# Patient Record
Sex: Male | Born: 1985 | Race: White | Hispanic: No | Marital: Single | Smoking: Current every day smoker
Health system: Southern US, Community
[De-identification: ages and names within clinical notes are randomized; demographics above are authoritative.]

## PROBLEM LIST (undated history)

## (undated) DIAGNOSIS — F329 Major depressive disorder, single episode, unspecified: Secondary | ICD-10-CM

## (undated) DIAGNOSIS — F259 Schizoaffective disorder, unspecified: Secondary | ICD-10-CM

## (undated) DIAGNOSIS — C9591 Leukemia, unspecified, in remission: Secondary | ICD-10-CM

## (undated) HISTORY — PX: OTHER SURGICAL HISTORY: SHX169

---

## 2018-03-23 ENCOUNTER — Encounter (HOSPITAL_COMMUNITY): Payer: Self-pay | Admitting: *Deleted

## 2018-03-23 ENCOUNTER — Emergency Department (HOSPITAL_COMMUNITY)
Admission: EM | Admit: 2018-03-23 | Discharge: 2018-03-23 | Disposition: A | Discharge status: Home / Self Care | Payer: Self-pay | Attending: Emergency Medicine | Admitting: Emergency Medicine

## 2018-03-23 ENCOUNTER — Encounter (HOSPITAL_COMMUNITY): Payer: Self-pay

## 2018-03-23 ENCOUNTER — Emergency Department (HOSPITAL_COMMUNITY): Payer: Self-pay

## 2018-03-23 DIAGNOSIS — R079 Chest pain, unspecified: Secondary | ICD-10-CM | POA: Insufficient documentation

## 2018-03-23 DIAGNOSIS — Z5321 Procedure and treatment not carried out due to patient leaving prior to being seen by health care provider: Secondary | ICD-10-CM | POA: Insufficient documentation

## 2018-03-23 DIAGNOSIS — Z915 Personal history of self-harm: Secondary | ICD-10-CM | POA: Insufficient documentation

## 2018-03-23 DIAGNOSIS — R000 Tachycardia, unspecified: Secondary | ICD-10-CM | POA: Insufficient documentation

## 2018-03-23 DIAGNOSIS — F1520 Other stimulant dependence, uncomplicated: Secondary | ICD-10-CM | POA: Insufficient documentation

## 2018-03-23 DIAGNOSIS — F17200 Nicotine dependence, unspecified, uncomplicated: Secondary | ICD-10-CM | POA: Insufficient documentation

## 2018-03-23 HISTORY — DX: Leukemia, unspecified, in remission: C95.91

## 2018-03-23 LAB — BASIC METABOLIC PANEL
Anion gap: 7 (ref 5–15)
BUN: 15 mg/dL (ref 6–20)
CALCIUM: 9.1 mg/dL (ref 8.9–10.3)
CO2: 30 mmol/L (ref 22–32)
CREATININE: 0.75 mg/dL (ref 0.61–1.24)
Chloride: 104 mmol/L (ref 101–111)
GFR calc Af Amer: >60 mL/min (ref >60)
Glucose, Bld: 89 mg/dL (ref 65–99)
POTASSIUM: 3.6 mmol/L (ref 3.5–5.1)
SODIUM: 141 mmol/L (ref 135–145)

## 2018-03-23 LAB — CBC
HEMATOCRIT: 44.4 % (ref 39.0–52.0)
Hemoglobin: 14.5 g/dL (ref 13.0–17.0)
MCH: 29.7 pg (ref 26.0–34.0)
MCHC: 32.7 g/dL (ref 30.0–36.0)
MCV: 91 fL (ref 78.0–100.0)
PLATELETS: 255 10*3/uL (ref 150–400)
RBC: 4.88 MIL/uL (ref 4.22–5.81)
RDW: 13 % (ref 11.5–15.5)
WBC: 8 10*3/uL (ref 4.0–10.5)

## 2018-03-23 LAB — I-STAT TROPONIN, ED: TROPONIN I, POC: 0 ng/mL (ref 0.00–0.08)

## 2018-03-23 NOTE — ED Notes (Signed)
Pt asking to leave, IV removed at triage per patient request, pt ambulatory without distress

## 2018-03-23 NOTE — Discharge Instructions (Signed)
Follow-up with the resources provided to you to help with your methamphetamine detox. Stop using drugs. Return to the ER as needed for any new or concerning symptoms.

## 2018-03-23 NOTE — ED Notes (Signed)
Bed: [facility]PT3 Expected date:  Expected time:  Means of arrival:  Comments: 

## 2018-03-23 NOTE — ED Provider Notes (Signed)
[facility] DEPT Provider Note   CSN: [id number] Arrival date & time: 03/23/18  6073     History   Chief Complaint No chief complaint on file.   HPI Rick Bray is a 32 y.o. male presenting with request for resources for detox from meth.   Pt states he has been trying to get clean from meth for the past year. He last used this morning. He also smokes marijuana, denies other drug use. He states he has been trying to get into a place in [place], but has been waiting 2 months. He recently came to [place] 2 days ago, and is interested in knowing what resources there are. He denies CP, SOB, abd pain, n/v. He in in remission for leukemia (since 2014). Takes no medication daily. He smokes cigarettes, denies etoh use. No complaints at this time.  He denies SI, HI, or AVH. He states he tells hospitals he is suicidal to help with detox from meth. He has cut himself in the past, no cuts recently.  HPI  Past Medical History:  Diagnosis Date  . Leukemia in remission ([place])     There are no active problems to display for this patient.   History reviewed. No pertinent surgical history.      Home Medications    Prior to Admission medications   Not on File    Family History History reviewed. No pertinent family history.  Social History Social History   Tobacco Use  . Smoking status: Current Every Day Smoker  . Smokeless tobacco: Never Used  Substance Use Topics  . Alcohol use: Yes  . Drug use: Yes    Types: Marijuana, Methamphetamines     Allergies   Patient has no known allergies.   Review of Systems Review of Systems  Psychiatric/Behavioral:       Request for detox from meth  All other systems reviewed and are negative.    Physical Exam Updated Vital Signs BP 127/80 (BP Location: Left Arm)   Pulse (!) 101   Temp 98.6 F (37 C) (Oral)   Resp 18   SpO2 99%   Physical Exam  Constitutional: He is oriented to person,  place, and time. He appears well-developed and well-nourished. No distress.  HENT:  Head: Normocephalic and atraumatic.  Eyes: EOM are normal.  Neck: Normal range of motion.  Cardiovascular: Regular rhythm and intact distal pulses.  Mildly tachycardic ~100  Pulmonary/Chest: Effort normal and breath sounds normal. No respiratory distress. He has no wheezes.  Speaking in full sentences. Clear lung sounds in all fields.   Abdominal: Soft. He exhibits no distension. There is no tenderness.  Musculoskeletal: Normal range of motion.  Neurological: He is alert and oriented to person, place, and time.  Skin: Skin is warm. No rash noted.  Psychiatric: He has a normal mood and affect.  Nursing note and vitals reviewed.    ED Treatments / Results  Labs (all labs ordered are listed, but only abnormal results are displayed) Labs Reviewed - No data to display  EKG None  Radiology Dg Chest 2 View  Result Date: 03/23/2018 CLINICAL DATA:  Chest pain with no other symptoms. History of leukemia in remission. Current smoker. EXAM: CHEST - 2 VIEW COMPARISON:  None in PACs FINDINGS: The lungs are adequately inflated. There is no focal infiltrate. There is no pleural effusion. The heart and pulmonary vascularity are normal. There is no mediastinal or hilar lymphadenopathy. The bony thorax is unremarkable. IMPRESSION: There is no  active cardiopulmonary disease. Electronically Signed   By: David  Martinique M.D.   On: 03/23/2018 10:59    Procedures Procedures (including critical care time)  Medications Ordered in ED Medications - No data to display   Initial Impression / Assessment and Plan / ED Course  I have reviewed the triage vital signs and the nursing notes.  Pertinent labs & imaging results that were available during my care of the patient were reviewed by me and considered in my medical decision making (see chart for details).     Patient requesting resources and help with detox from meth.   No concerns at this time.  Physical exam shows patient who is calm and cooperative, and in no acute distress.  Recent labs performed today at [facility] without concerning abnormalities.  Discussed with case management, who will provide resources.  Peer support ordered.  At this time, patient appears safe for discharge.  Return precautions given.  Patient states he understands and agrees to plan.   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

## 2018-03-23 NOTE — Progress Notes (Addendum)
CSW met with pt to offer resources, initally pt refused but as session progressed, pt accepted meeting schedules for area Alcoholics Anonymous and Narcotics Anonymous 12-Step meetings as well as inpatient/outpatient SA Tx resources.  CSW provided education to the pt as to the efficacy of 12-step programs for community support for those needing support in addition to or other than inpatient/outpatient treatment and provided pt with list of [employer] in [place] and educated the pt on making contact, assessing for open beds and requesting an interview/assessment for admission. Pt appreciated CSW's efforts and thanked the CSW.  Pt stated he was able to stay at the [facility] and to sleep in the lobby.  Please reconsult if future social work needs arise.  CSW signing off, as social work intervention is no longer needed.  Alphonse Guild. Mekhi Sonn, LCSW, LCAS, CSI Clinical Social Worker Ph: [phone]       2

## 2018-03-23 NOTE — ED Triage Notes (Signed)
Pt in via EMS to triage- c/o chest pain after walking for an hour, pain rated 7/10, denies n/v, pt reports that he smoked methamphetamines and used marijuana this morning, no distress noted

## 2018-03-23 NOTE — ED Triage Notes (Signed)
Pt here for help with methamphetamine detox, he states he has been trying to get in a program in [place] and has been stranded here for a few days, he was seen at [facility] earlier for chest pain from doing doing meth  Pt denies SI at this time but does have a history of cutting

## 2018-03-25 ENCOUNTER — Emergency Department (HOSPITAL_COMMUNITY)
Admission: EM | Admit: 2018-03-25 | Discharge: 2018-03-26 | Disposition: A | Discharge status: Other Acute Inpatient Hospital | Payer: Self-pay | Attending: Emergency Medicine | Admitting: Emergency Medicine

## 2018-03-25 DIAGNOSIS — F1520 Other stimulant dependence, uncomplicated: Secondary | ICD-10-CM | POA: Insufficient documentation

## 2018-03-25 DIAGNOSIS — R45851 Suicidal ideations: Secondary | ICD-10-CM | POA: Insufficient documentation

## 2018-03-25 DIAGNOSIS — F17200 Nicotine dependence, unspecified, uncomplicated: Secondary | ICD-10-CM | POA: Insufficient documentation

## 2018-03-25 DIAGNOSIS — F332 Major depressive disorder, recurrent severe without psychotic features: Secondary | ICD-10-CM | POA: Insufficient documentation

## 2018-03-25 LAB — RAPID URINE DRUG SCREEN, HOSP PERFORMED
AMPHETAMINES: POSITIVE — AB
BARBITURATES: NOT DETECTED
BENZODIAZEPINES: NOT DETECTED
Cocaine: NOT DETECTED
Opiates: NOT DETECTED
TETRAHYDROCANNABINOL: NOT DETECTED

## 2018-03-25 LAB — COMPREHENSIVE METABOLIC PANEL
ALBUMIN: 4 g/dL (ref 3.5–5.0)
ALT: 18 U/L (ref 17–63)
AST: 16 U/L (ref 15–41)
Alkaline Phosphatase: 64 U/L (ref 38–126)
Anion gap: 6 (ref 5–15)
BUN: 25 mg/dL — AB (ref 6–20)
CHLORIDE: 108 mmol/L (ref 101–111)
CO2: 27 mmol/L (ref 22–32)
CREATININE: 0.67 mg/dL (ref 0.61–1.24)
Calcium: 8.8 mg/dL — ABNORMAL LOW (ref 8.9–10.3)
GFR calc Af Amer: >60 mL/min (ref >60)
GLUCOSE: 98 mg/dL (ref 65–99)
POTASSIUM: 4.1 mmol/L (ref 3.5–5.1)
Sodium: 141 mmol/L (ref 135–145)
Total Bilirubin: 0.5 mg/dL (ref 0.3–1.2)
Total Protein: 6.7 g/dL (ref 6.5–8.1)

## 2018-03-25 LAB — CBC
HEMATOCRIT: 40.2 % (ref 39.0–52.0)
HEMOGLOBIN: 13.6 g/dL (ref 13.0–17.0)
MCH: 30.4 pg (ref 26.0–34.0)
MCHC: 33.8 g/dL (ref 30.0–36.0)
MCV: 89.9 fL (ref 78.0–100.0)
Platelets: 260 10*3/uL (ref 150–400)
RBC: 4.47 MIL/uL (ref 4.22–5.81)
RDW: 13.4 % (ref 11.5–15.5)
WBC: 7.9 10*3/uL (ref 4.0–10.5)

## 2018-03-25 LAB — SALICYLATE LEVEL: Salicylate Lvl: <7 mg/dL (ref 2.8–30.0)

## 2018-03-25 LAB — ETHANOL

## 2018-03-25 LAB — ACETAMINOPHEN LEVEL: Acetaminophen (Tylenol), Serum: <10 ug/mL — ABNORMAL LOW (ref 10–30)

## 2018-03-25 MED ORDER — ACETAMINOPHEN 325 MG PO TABS: 650.0000 mg | ORAL_TABLET | ORAL | Status: DC | PRN

## 2018-03-25 MED ORDER — LIDOCAINE HCL (PF) 1 % IJ SOLN
5.0000 mL | Freq: Once | INTRAMUSCULAR | Status: AC
  Administered 2018-03-25: 5 mL via INTRADERMAL
  Filled 2018-03-25 (×3): qty 5

## 2018-03-25 MED ORDER — NICOTINE 21 MG/24HR TD PT24: 21.0000 mg | MEDICATED_PATCH | Freq: Every day | TRANSDERMAL | Status: DC

## 2018-03-25 MED ORDER — ONDANSETRON HCL 4 MG PO TABS: 4.0000 mg | ORAL_TABLET | Freq: Three times a day (TID) | ORAL | Status: DC | PRN

## 2018-03-25 MED ORDER — DOXYCYCLINE HYCLATE 100 MG PO TABS
100.0000 mg | ORAL_TABLET | Freq: Two times a day (BID) | ORAL | Status: DC
  Administered 2018-03-25: 100 mg via ORAL
  Filled 2018-03-25: qty 1

## 2018-03-25 MED ORDER — DOXYCYCLINE HYCLATE 100 MG PO TABS: 100.0000 mg | ORAL_TABLET | Freq: Once | ORAL | Status: DC

## 2018-03-25 MED ORDER — ALUM & MAG HYDROXIDE-SIMETH 200-200-20 MG/5ML PO SUSP: 30.0000 mL | Freq: Four times a day (QID) | ORAL | Status: DC | PRN

## 2018-03-25 NOTE — ED Triage Notes (Signed)
Patient stated he was suicidal at [place]. He stated he wanted to cut hisself or OD on drugs. Patient states he has cut hisself really bad before. Patient is crying and depressed.

## 2018-03-25 NOTE — ED Provider Notes (Signed)
[facility] DEPT Provider Note   CSN: [id number] Arrival date & time: 03/25/18  1911     History   Chief Complaint Chief Complaint  Patient presents with  . Suicidal    HPI Rick Bray is a 32 y.o. male who presents to the ED with a cc of suicidal ideation. The patient states that he is feeling hopeless and suicidal. He thought about overdosing on heroin because he could " just go to sleep." The patient is tearful and states that his family has " turned their back on me." He uses meth and etoh but has not use in the past 3 days. He has a hx of Meth IVDU and states that he has not shot up in weeks.  He is complaining of a small sore on his right thoracic area and accidentally cut his right middle finger about 3 days ago with a knife.  He complains of pain and swelling over the dorsum of his right middle finger.  HPI  Past Medical History:  Diagnosis Date  . Leukemia in remission ([place])     There are no active problems to display for this patient.   No past surgical history on file.      Home Medications    Prior to Admission medications   Not on File    Family History No family history on file.  Social History Social History   Tobacco Use  . Smoking status: Current Every Day Smoker  . Smokeless tobacco: Never Used  Substance Use Topics  . Alcohol use: Yes  . Drug use: Yes    Types: Marijuana, Methamphetamines     Allergies   Risperidone and related   Review of Systems Review of Systems Ten systems reviewed and are negative for acute change, except as noted in the HPI.    Physical Exam Updated Vital Signs BP 132/85 (BP Location: Left Arm)   Pulse 85   Temp 98.6 F (37 C) (Oral)   Resp 16   Ht 5\' 6"  (1.676 m)   Wt 59 kg (130 lb)   SpO2 100%   BMI 20.98 kg/m   Physical Exam  Constitutional: He appears well-developed and well-nourished. No distress.  HENT:  Head: Normocephalic and atraumatic.  Eyes:  Conjunctivae are normal. No scleral icterus.  Neck: Normal range of motion. Neck supple.  Cardiovascular: Normal rate, regular rhythm and normal heart sounds.  Pulmonary/Chest: Effort normal and breath sounds normal. No respiratory distress.  Abdominal: Soft. There is no tenderness.  Musculoskeletal: He exhibits no edema.  Right middle finger with macerated laceration over the dorsum.  Tender to palpation, no purulent discharge or abscess.  Able to move the finger at all joints  Neurological: He is alert.  Skin: Skin is warm and dry. He is not diaphoretic.  2 cm raised erythematous nodule with central crusting, tender to palpation with surrounding erythema  Psychiatric: His behavior is normal.  Nursing note and vitals reviewed.    ED Treatments / Results  Labs (all labs ordered are listed, but only abnormal results are displayed) Labs Reviewed  COMPREHENSIVE METABOLIC PANEL - Abnormal; Notable for the following components:      Result Value   BUN 25 (*)    Calcium 8.8 (*)    All other components within normal limits  ACETAMINOPHEN LEVEL - Abnormal; Notable for the following components:   Acetaminophen (Tylenol), Serum <10 (*)    All other components within normal limits  RAPID URINE DRUG SCREEN, HOSP  PERFORMED - Abnormal; Notable for the following components:   Amphetamines POSITIVE (*)    All other components within normal limits  ETHANOL  SALICYLATE LEVEL  CBC    INCISION AND DRAINAGE Performed by: Margarita Mail Consent: Verbal consent obtained. Risks and benefits: risks, benefits and alternatives were discussed Type: abscess  Body area: R axillary line  Anesthesia: local infiltration  Incision was made with a scalpel.  Local anesthetic: lidocaine 1% w/o epinephrine  Anesthetic total: 3 ml  Complexity: complex Blunt dissection to break up loculations  Drainage: purulent  Drainage amount: minimal  Packing material: 1/4 in iodoform gauze  Patient  tolerance: Patient tolerated the procedure well with no immediate complications.   EKG None  Radiology No results found.  Procedures Procedures (including critical care time)  Medications Ordered in ED Medications  doxycycline (VIBRA-TABS) tablet 100 mg (has no administration in time range)  acetaminophen (TYLENOL) tablet 650 mg (has no administration in time range)  ondansetron (ZOFRAN) tablet 4 mg (has no administration in time range)  alum & mag hydroxide-simeth (MAALOX/MYLANTA) 200-200-20 MG/5ML suspension 30 mL (has no administration in time range)  nicotine (NICODERM CQ - dosed in mg/24 hours) patch 21 mg (has no administration in time range)  lidocaine (PF) (XYLOCAINE) 1 % injection 5 mL (5 mLs Intradermal Given 03/25/18 2155)     Initial Impression / Assessment and Plan / ED Course  I have reviewed the triage vital signs and the nursing notes.  Pertinent labs & imaging results that were available during my care of the patient were reviewed by me and considered in my medical decision making (see chart for details).     Patient medically clear for psychiatric evaluation.  He has been placed on 1 week of doxycycline.  Incision and drainage performed with minimal purulent drainage from the small abscess on his right axillary line.  Patient may need bandage changing.  Final Clinical Impressions(s) / ED Diagnoses   Final diagnoses:  Suicidal ideation    ED Discharge Orders    None       Margarita Mail, PA-C 03/25/18 2235    Lacretia Leigh, MD 03/26/18 1646

## 2018-03-25 NOTE — BHH Counselor (Signed)
Pt has been accepted to [facility] by Arville Go, [facility] and assigned to room/bed: 306-1. Attending physician: Dr. Mallie Darting. Pt can come now. Nursing report: [phone]. Support paperwork is completed and faxed. Updated dispostion dicussed with Craig, RN.   Vertell Novak, MS, [facility], [facility] Triage Specialist [phone]

## 2018-03-25 NOTE — ED Notes (Signed)
Patient's  property a machete, pocket knife, homemade shank and phillip screwdriver given to security officers to secure.

## 2018-03-25 NOTE — ED Provider Notes (Signed)
Medical screening examination/treatment/procedure(s) were conducted as a shared visit with non-physician practitioner(s) and myself.  I personally evaluated the patient during the encounter.  None 32 year old male here complaining of suicidal ideations.  He also has a prior cut to the dorsal aspect of his right middle finger.  No evidence of extensive infection.  Can treat with oral antibiotics.  We will medically clear him and have evaluation by psychiatry   Lacretia Leigh, MD 03/25/18 2144
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
## 2018-03-25 NOTE — ED Notes (Signed)
Bed: PNS25 Expected date:  Expected time:  Means of arrival:  Comments: RM 29

## 2018-03-26 ENCOUNTER — Other Ambulatory Visit: Payer: Self-pay

## 2018-03-26 ENCOUNTER — Inpatient Hospital Stay (HOSPITAL_COMMUNITY)
Admission: AD | Admit: 2018-03-26 | Discharge: 2018-03-31 | DRG: 885 | Disposition: A | Discharge status: Home / Self Care | Payer: No Typology Code available for payment source | Source: Intra-hospital | Attending: Psychiatry | Admitting: Psychiatry

## 2018-03-26 ENCOUNTER — Encounter (HOSPITAL_COMMUNITY): Payer: Self-pay

## 2018-03-26 DIAGNOSIS — Z888 Allergy status to other drugs, medicaments and biological substances status: Secondary | ICD-10-CM | POA: Diagnosis not present

## 2018-03-26 DIAGNOSIS — Z818 Family history of other mental and behavioral disorders: Secondary | ICD-10-CM

## 2018-03-26 DIAGNOSIS — Z590 Homelessness: Secondary | ICD-10-CM | POA: Diagnosis not present

## 2018-03-26 DIAGNOSIS — F17210 Nicotine dependence, cigarettes, uncomplicated: Secondary | ICD-10-CM | POA: Diagnosis present

## 2018-03-26 DIAGNOSIS — Z915 Personal history of self-harm: Secondary | ICD-10-CM

## 2018-03-26 DIAGNOSIS — F4000 Agoraphobia, unspecified: Secondary | ICD-10-CM | POA: Diagnosis present

## 2018-03-26 DIAGNOSIS — G4700 Insomnia, unspecified: Secondary | ICD-10-CM | POA: Diagnosis present

## 2018-03-26 DIAGNOSIS — F1994 Other psychoactive substance use, unspecified with psychoactive substance-induced mood disorder: Secondary | ICD-10-CM | POA: Diagnosis present

## 2018-03-26 DIAGNOSIS — Z825 Family history of asthma and other chronic lower respiratory diseases: Secondary | ICD-10-CM

## 2018-03-26 DIAGNOSIS — F332 Major depressive disorder, recurrent severe without psychotic features: Principal | ICD-10-CM | POA: Diagnosis present

## 2018-03-26 DIAGNOSIS — F1210 Cannabis abuse, uncomplicated: Secondary | ICD-10-CM | POA: Diagnosis present

## 2018-03-26 DIAGNOSIS — Z639 Problem related to primary support group, unspecified: Secondary | ICD-10-CM | POA: Diagnosis not present

## 2018-03-26 DIAGNOSIS — Z560 Unemployment, unspecified: Secondary | ICD-10-CM

## 2018-03-26 DIAGNOSIS — F1510 Other stimulant abuse, uncomplicated: Secondary | ICD-10-CM | POA: Diagnosis present

## 2018-03-26 DIAGNOSIS — C9591 Leukemia, unspecified, in remission: Secondary | ICD-10-CM | POA: Diagnosis present

## 2018-03-26 DIAGNOSIS — R45851 Suicidal ideations: Secondary | ICD-10-CM | POA: Diagnosis present

## 2018-03-26 DIAGNOSIS — F1524 Other stimulant dependence with stimulant-induced mood disorder: Secondary | ICD-10-CM

## 2018-03-26 DIAGNOSIS — F419 Anxiety disorder, unspecified: Secondary | ICD-10-CM | POA: Diagnosis not present

## 2018-03-26 DIAGNOSIS — J869 Pyothorax without fistula: Secondary | ICD-10-CM | POA: Diagnosis not present

## 2018-03-26 DIAGNOSIS — F1910 Other psychoactive substance abuse, uncomplicated: Secondary | ICD-10-CM | POA: Diagnosis not present

## 2018-03-26 MED ORDER — ARIPIPRAZOLE 5 MG PO TABS
5.0000 mg | ORAL_TABLET | Freq: Every day | ORAL | Status: DC
  Administered 2018-03-26 – 2018-03-30 (×5): 5 mg via ORAL
  Filled 2018-03-26 (×7): qty 1

## 2018-03-26 MED ORDER — HYDROXYZINE HCL 25 MG PO TABS
25.0000 mg | ORAL_TABLET | Freq: Four times a day (QID) | ORAL | Status: DC | PRN
  Administered 2018-03-28 – 2018-03-30 (×3): 25 mg via ORAL
  Filled 2018-03-26: qty 30
  Filled 2018-03-26 (×2): qty 1

## 2018-03-26 MED ORDER — ACETAMINOPHEN 325 MG PO TABS: 650.0000 mg | ORAL_TABLET | Freq: Four times a day (QID) | ORAL | Status: DC | PRN

## 2018-03-26 MED ORDER — ALUM & MAG HYDROXIDE-SIMETH 200-200-20 MG/5ML PO SUSP: 30.0000 mL | ORAL | Status: DC | PRN

## 2018-03-26 MED ORDER — MAGNESIUM HYDROXIDE 400 MG/5ML PO SUSP: 30.0000 mL | Freq: Every day | ORAL | Status: DC | PRN

## 2018-03-26 MED ORDER — TRAZODONE HCL 50 MG PO TABS
50.0000 mg | ORAL_TABLET | Freq: Every evening | ORAL | Status: DC | PRN
  Administered 2018-03-26 – 2018-03-30 (×6): 50 mg via ORAL
  Filled 2018-03-26 (×14): qty 1

## 2018-03-26 MED ORDER — CITALOPRAM HYDROBROMIDE 10 MG PO TABS
10.0000 mg | ORAL_TABLET | Freq: Every day | ORAL | Status: DC
  Administered 2018-03-26 – 2018-03-31 (×6): 10 mg via ORAL
  Filled 2018-03-26 (×6): qty 1
  Filled 2018-03-26: qty 21
  Filled 2018-03-26 (×2): qty 1
  Filled 2018-03-26: qty 21

## 2018-03-26 NOTE — H&P (Signed)
Psychiatric Admission Assessment Adult  Patient Identification: Itzael Liptak MRN:  [id number] Date of Evaluation:  03/26/2018 Chief Complaint: Depression  Principal Diagnosis: Substance-induced mood disorder, depressed .  Stimulant dependence .  Consider major depression , severe , without psychotic features  Diagnosis:   Patient Active Problem List   Diagnosis Date Noted  . MDD (major depressive disorder), recurrent episode, severe ([place]) [F33.2] 03/26/2018   History of Present Illness: 32 year old male, currently homeless, reports worsening depression and suicidal thoughts, with thoughts of cutting self.  Endorses significant neurovegetative symptoms of depression as below.  Reports he has been facing significant stressors to include homelessness and poor support network. He reports substance abuse, identifies methamphetamine as substance of choice, has been using almost daily /he also endorses cannabis abuse Associated Signs/Symptoms: Depression Symptoms:  depressed mood, anhedonia, insomnia, suicidal thoughts with specific plan, anxiety, panic attacks, loss of energy/fatigue, decreased appetite, (Hypo) Manic Symptoms:  Does not endorse/irritability Anxiety Symptoms: Reports worsening anxiety, states he worries excessively.  Also endorses agoraphobia Psychotic Symptoms: Denies PTSD Symptoms: Does not endorse Total Time spent with patient: 45 minutes  Past Psychiatric History: He reports a history of depression, one prior psychiatric admission approximately 3 years ago for "the same".  Reports prior suicide attempts by overdosing in 2014 and by cutting himself in 2016.  He reports history of cutting and has old scars on forearms, denies any recent self-injurious behaviors.  Endorses excessive worrying/anxiety sometimes leading to panic symptoms.  Also acknowledges agoraphobia.  States that he has been told he has "schizoaffective" in the past but is not currently endorsing any history  of psychosis  Is the patient at risk to self? Yes.    Has the patient been a risk to self in the past 6 months? Yes.    Has the patient been a risk to self within the distant past? Yes.    Is the patient a risk to others? No.  Has the patient been a risk to others in the past 6 months? No.  Has the patient been a risk to others within the distant past? No.   Prior Inpatient Therapy:   Prior Outpatient Therapy:    Alcohol Screening: 1. How often do you have a drink containing alcohol?: 2 to 4 times a month 2. How many drinks containing alcohol do you have on a typical day when you are drinking?: 3 or 4 3. How often do you have six or more drinks on one occasion?: Less than monthly AUDIT-C Score: 4 4. How often during the last year have you found that you were not able to stop drinking once you had started?: Never 5. How often during the last year have you failed to do what was normally expected from you becasue of drinking?: Never 6. How often during the last year have you needed a first drink in the morning to get yourself going after a heavy drinking session?: Never 7. How often during the last year have you had a feeling of guilt of remorse after drinking?: Never 8. How often during the last year have you been unable to remember what happened the night before because you had been drinking?: Never 9. Have you or someone else been injured as a result of your drinking?: No 10. Has a relative or friend or a doctor or another health worker been concerned about your drinking or suggested you cut down?: No Alcohol Use Disorder Identification Test Final Score (AUDIT): 4 Intervention/Follow-up: AUDIT Score <7 follow-up not indicated Substance  Abuse History in the last 12 months: Denies alcohol abuse, reports history of methamphetamine/stimulant abuse and identifies this substance has drug of choice, cannabis abuse, uses on most days. Consequences of Substance Abuse: Does not report Previous  Psychotropic Medications: Remembers having been on Celexa and on Abilify in the past.  Has not been on any psychiatric medications for months.  Feels that Abilify was helpful. Psychological Evaluations: Past Medical History: Currently denies history of medical illnesses, not allergic to any medications, smokes a pack of cigarettes per day, was not taking any medications prior to admission. Past Medical History:  Diagnosis Date  . Leukemia in remission [facility])    History reviewed. No pertinent surgical history. Family History: Mother deceased from complications of COPD .Father alive, limited contact.  Has 1 sister.  States his father was diagnosed with schizophrenia  Family Psychiatric  History: Reports father was diagnosed with schizophrenia. Tobacco Screening: Have you used any form of tobacco in the last 30 days? (Cigarettes, Smokeless Tobacco, Cigars, and/or Pipes): Yes Tobacco use, Select all that apply: 5 or more cigarettes per day, smokeless tobacco use daily Are you interested in Tobacco Cessation Medications?: Yes, will notify MD for an order Counseled patient on smoking cessation including recognizing danger situations, developing coping skills and basic information about quitting provided: Yes Social History: 32 year old, single, has 3 children ages 63, 20, 51 years old.  Children live with her mother.  Patient is currently homeless.  Denies legal issues.  Currently unemployed Social History   Substance and Sexual Activity  Alcohol Use Yes     Social History   Substance and Sexual Activity  Drug Use Yes  . Types: Marijuana, Methamphetamines    Additional Social History: Marital status: Long term relationship Long term relationship, how long?: 3 months What types of issues is patient dealing with in the relationship?: Anger, and when he tries to tell her how he feels, she gets even angrier Are you sexually active?: Yes What is your sexual orientation?: Heterosexual Has your  sexual activity been affected by drugs, alcohol, medication, or emotional stress?: None Does patient have children?: Yes How many children?: 3 How is patient's relationship with their children?: 12yo, 11yo, and 8yo - has no relationship with any of them    Pain Medications: None Prescriptions: See MAR Over the Counter: None History of alcohol / drug use?: Yes Longest period of sobriety (when/how long): unknown Negative Consequences of Use: Financial, Personal relationships Name of Substance 1: Meth.  1 - Age of First Use: Pt reported, 16.  1 - Amount (size/oz): Pt reported, snorting half a gram, yesterday.  1 - Duration: Ongoing.  1 - Last Use / Amount: Pt reported, yesterday. Name of Substance 2: Marijuana.  2 - Age of First Use: UTA 2 - Amount (size/oz): Pt reported, smoking a blunt, yesterday.  2 - Frequency: UTA 2 - Duration: UTA 2 - Last Use / Amount: Pt reported, yesterday.  Allergies:   Allergies  Allergen Reactions  . Risperidone And Related     Faint   Lab Results:  Results for orders placed or performed during the hospital encounter of 03/25/18 (from the past 48 hour(s))  Rapid urine drug screen (hospital performed)     Status: Abnormal   Collection Time: 03/25/18  7:43 PM  Result Value Ref Range   Opiates NONE DETECTED NONE DETECTED   Cocaine NONE DETECTED NONE DETECTED   Benzodiazepines NONE DETECTED NONE DETECTED   Amphetamines POSITIVE (A) NONE DETECTED   Tetrahydrocannabinol  NONE DETECTED NONE DETECTED   Barbiturates NONE DETECTED NONE DETECTED    Comment: (NOTE) DRUG SCREEN FOR MEDICAL PURPOSES ONLY.  IF CONFIRMATION IS NEEDED FOR ANY PURPOSE, NOTIFY LAB WITHIN 5 DAYS. LOWEST DETECTABLE LIMITS FOR URINE DRUG SCREEN Drug Class                     Cutoff (ng/mL) Amphetamine and metabolites    1000 Barbiturate and metabolites    200 Benzodiazepine                 591 Tricyclics and metabolites     300 Opiates and metabolites        300 Cocaine and  metabolites        300 THC                            50 Performed at [facility], [place] [address] [place], [place] [address]   Comprehensive metabolic panel     Status: Abnormal   Collection Time: 03/25/18  8:03 PM  Result Value Ref Range   Sodium 141 135 - 145 mmol/L   Potassium 4.1 3.5 - 5.1 mmol/L   Chloride 108 101 - 111 mmol/L   CO2 27 22 - 32 mmol/L   Glucose, Bld 98 65 - 99 mg/dL   BUN 25 (H) 6 - 20 mg/dL   Creatinine, Ser 0.67 0.61 - 1.24 mg/dL   Calcium 8.8 (L) 8.9 - 10.3 mg/dL   Total Protein 6.7 6.5 - 8.1 g/dL   Albumin 4.0 3.5 - 5.0 g/dL   AST 16 15 - 41 U/L   ALT 18 17 - 63 U/L   Alkaline Phosphatase 64 38 - 126 U/L   Total Bilirubin 0.5 0.3 - 1.2 mg/dL   GFR calc non Af Amer >60 >60 mL/min   GFR calc Af Amer >60 >60 mL/min    Comment: (NOTE) The eGFR has been calculated using the CKD EPI equation. This calculation has not been validated in all clinical situations. eGFR's persistently <60 mL/min signify possible Chronic Kidney Disease.    Anion gap 6 5 - 15    Comment: Performed at [facility], [place] [address] [place], [place] [address]  Ethanol     Status: None   Collection Time: 03/25/18  8:03 PM  Result Value Ref Range   Alcohol, Ethyl (B) <10 <10 mg/dL    Comment: (NOTE) Lowest detectable limit for serum alcohol is 10 mg/dL. For medical purposes only. Performed at [facility], [place] [address], [place], [place] [address]   Salicylate level     Status: None   Collection Time: 03/25/18  8:03 PM  Result Value Ref Range   Salicylate Lvl <7.9 2.8 - 30.0 mg/dL    Comment: Performed at [facility], [place] [address] [place],  [address]  Acetaminophen level     Status: Abnormal   Collection Time: 03/25/18  8:03 PM  Result Value Ref Range   Acetaminophen (Tylenol), Serum <10 (L) 10 - 30 ug/mL    Comment: (NOTE) Therapeutic concentrations vary significantly. A range of 10-30  ug/mL  may be an effective concentration for many patients. However, some  are best treated at concentrations outside of this range. Acetaminophen concentrations >150 ug/mL at 4 hours after ingestion  and >50 ug/mL at 12 hours after ingestion are often associated with  toxic reactions. Performed at [facility], [place] [address], [place], [place] [address]   cbc     Status: None   Collection Time: 03/25/18  8:03 PM  Result Value Ref Range   WBC 7.9 4.0 - 10.5 K/uL   RBC 4.47 4.22 - 5.81 MIL/uL   Hemoglobin 13.6 13.0 - 17.0 g/dL   HCT 40.2 39.0 - 52.0 %   MCV 89.9 78.0 - 100.0 fL   MCH 30.4 26.0 - 34.0 pg   MCHC 33.8 30.0 - 36.0 g/dL   RDW 13.4 11.5 - 15.5 %   Platelets 260 150 - 400 K/uL    Comment: Performed at [facility], [place] [address] [place], [place] [address]    Blood Alcohol level:  Lab Results  Component Value Date   ETH <10 35/00/9381    Metabolic Disorder Labs:  No results found for: HGBA1C, MPG No results found for: PROLACTIN No results found for: CHOL, TRIG, HDL, CHOLHDL, VLDL, LDLCALC  Current Medications: Current Facility-Administered Medications  Medication Dose Route Frequency Provider Last Rate Last Dose  . acetaminophen (TYLENOL) tablet 650 mg  650 mg Oral Q6H PRN Laverle Hobby, PA-C      . alum & mag hydroxide-simeth (MAALOX/MYLANTA) 200-200-20 MG/5ML suspension 30 mL  30 mL Oral Q4H PRN Laverle Hobby, PA-C      . hydrOXYzine (ATARAX/VISTARIL) tablet 25 mg  25 mg Oral Q6H PRN Patriciaann Clan E, PA-C      . magnesium hydroxide (MILK OF MAGNESIA) suspension 30 mL  30 mL Oral Daily PRN Laverle Hobby, PA-C      . traZODone (DESYREL) tablet 50 mg  50 mg Oral QHS,MR X 1 Simon, Spencer E, PA-C       PTA Medications: No medications prior to admission.    Musculoskeletal: Strength & Muscle Tone: within normal limits Gait & Station: normal Patient leans: N/A  Psychiatric Specialty Exam: Physical Exam   Review of Systems  Constitutional: Positive for weight loss.  HENT: Negative.   Eyes: Negative.   Respiratory: Negative.   Cardiovascular: Negative.   Gastrointestinal: Negative.   Genitourinary: Negative.   Musculoskeletal: Negative.   Skin: Negative.   Neurological: Negative for seizures.  Endo/Heme/Allergies: Negative.   Psychiatric/Behavioral: Positive for depression, substance abuse and suicidal ideas.  All other systems reviewed and are negative.   Blood pressure 127/83, pulse 83, temperature 97.6 F (36.4 C), temperature source Oral, resp. rate 19, height 5' 6.14" (1.68 m), weight 55.8 kg (123 lb), SpO2 99 %.Body mass index is 19.77 kg/m.  General Appearance: Fairly Groomed  Eye Contact:  Fair  Speech:  Normal Rate  Volume:  Decreased  Mood:  Depressed and Dysphoric  Affect:  Constricted  Thought Process:  Linear and Descriptions of Associations: Intact  Orientation:  Other:  Fully alert and attentive  Thought Content:  Does not endorse hallucinations, no delusions are expressed, does not appear to be i internally preoccupied  Suicidal Thoughts:  No at this time denies active suicidal ideations and is able to contract for safety on the unit denies any homicidal or violent ideations  Homicidal Thoughts:  No  Memory:  Recent and remote grossly intact  Judgement:  Fair  Insight:  Fair  Psychomotor Activity:  Decreased  Concentration:  Concentration: Fair and Attention Span: Fair  Recall:  [employer] of Knowledge:  Good  Language:  Good  Akathisia:  Negative  Handed:  Right  AIMS (if indicated):     Assets:  Desire for  Improvement Resilience  ADL's:  Intact  Cognition:  WNL  Sleep:  Number of Hours: 4    Treatment Plan Summary: Daily contact with patient to assess and evaluate symptoms and progress in treatment, Medication management, Plan Inpatient treatment and Medications as below  Observation Level/Precautions:  15 minute checks  Laboratory:  As needed   Psychotherapy: Milieu and group therapy  Medications: We discussed options, will start Abilify 5 mg daily initially and Celexa 10 mg daily  Consultations: As needed  Discharge Concerns: Homelessness, limited support network  Estimated LOS:5 days   Other: Patient interested in going to a rehab setting at discharge   Physician Treatment Plan for Primary Diagnosis: Stimulant use disorder, cannabis use disorder  Long Term Goal(s): Improvement in symptoms so as ready for discharge  Short Term Goals: Ability to identify triggers associated with substance abuse/mental health issues will improve  Physician Treatment Plan for Secondary Diagnosis: Substance-induced mood disorder versus major depression Long Term Goal(s): Improvement in symptoms so as ready for discharge  Short Term Goals: Ability to identify changes in lifestyle to reduce recurrence of condition will improve, Ability to verbalize feelings will improve, Ability to disclose and discuss suicidal ideas, Ability to demonstrate self-control will improve, Ability to identify and develop effective coping behaviors will improve and Ability to maintain clinical measurements within normal limits will improve  I certify that inpatient services furnished can reasonably be expected to improve the patient's condition.    Jenne Campus, MD 6/1/20192:41 PM

## 2018-03-26 NOTE — BHH Suicide Risk Assessment (Signed)
[facility] Admission Suicide Risk Assessment   Nursing information obtained from:  Patient Demographic factors:  Caucasian, Unemployed, Low socioeconomic status Current Mental Status:  Suicidal ideation indicated by patient, Self-harm thoughts Loss Factors:  Financial problems / change in socioeconomic status Historical Factors:  NA Risk Reduction Factors:  Positive coping skills or problem solving skills  Total Time spent with patient: 45 minutes Principal Problem: Stimulant dependence , depression  Diagnosis:   Patient Active Problem List   Diagnosis Date Noted  . MDD (major depressive disorder), recurrent episode, severe ([place]) [F33.2] 03/26/2018   Subjective Data:   Continued Clinical Symptoms:  Alcohol Use Disorder Identification Test Final Score (AUDIT): 4 The "Alcohol Use Disorders Identification Test", Guidelines for Use in Primary Care, Second Edition.  World Pharmacologist [facility]). Score between 0-7:  no or low risk or alcohol related problems. Score between 8-15:  moderate risk of alcohol related problems. Score between 16-19:  high risk of alcohol related problems. Score 20 or above:  warrants further diagnostic evaluation for alcohol dependence and treatment.   CLINICAL FACTORS:  32 year old single male, currently homeless.  Presented due to worsening depression and suicidal ideations, with thoughts of self cutting.  History of substance abuse, identifies methamphetamine as substance of choice, has been using regularly/daily.   Psychiatric Specialty Exam: Physical Exam  ROS  Blood pressure 127/83, pulse 83, temperature 97.6 F (36.4 C), temperature source Oral, resp. rate 19, height 5' 6.14" (1.68 m), weight 55.8 kg (123 lb), SpO2 99 %.Body mass index is 19.77 kg/m.  See admit note MSE    COGNITIVE FEATURES THAT CONTRIBUTE TO RISK:  Closed-mindedness and Loss of executive function    SUICIDE RISK:   Moderate:  Frequent suicidal ideation with limited intensity,  and duration, some specificity in terms of plans, no associated intent, good self-control, limited dysphoria/symptomatology, some risk factors present, and identifiable protective factors, including available and accessible social support.  PLAN OF CARE: Patient will be admitted to inpatient psychiatric unit for stabilization and safety. Will provide and encourage milieu participation. Provide medication management and maked adjustments as needed.  Will follow daily.    I certify that inpatient services furnished can reasonably be expected to improve the patient's condition.   Jenne Campus, MD 03/26/2018, 2:41 PM

## 2018-03-26 NOTE — BHH Group Notes (Signed)
Identifying Needs   Date:  03/26/2018  Time:  3:38 PM  Type of Therapy:  Nurse Education  /  The group focused on teaching patients how to identify their needs and then how to develop healthier skills needed for them to get those needs met.  rticipation Level:  Pt did not attend.  Participation Quality:    Affect:    Cognitive:    Insight:    Engagement in Group:    Modes of Intervention:    Summary of Progress/Problems:  Lauralyn Primes 03/26/2018, 3:38 PM

## 2018-03-26 NOTE — BHH Suicide Risk Assessment (Signed)
[place] INPATIENT:  Family/Significant Other Suicide Prevention Education  Suicide Prevention Education:  Patient Refusal for Family/Significant Other Suicide Prevention Education: The patient Rick Bray has refused to provide written consent for family/significant other to be provided Family/Significant Other Suicide Prevention Education during admission and/or prior to discharge.  Physician notified.  Berlin Hun Grossman-Orr 03/26/2018, 1:59 PM

## 2018-03-26 NOTE — Plan of Care (Addendum)
Patient sleeping upon approach today. Interaction is very minimal yet needy. Patient reports he "just wants to sleep" although he claimed to have slept well since he got here early this morning. Patient denies SI/HI/AVH. Refused to fill out self inventory. Patient applied bandage onto finger with antibiotics as well as a bandage to his right rib cage.  Safety maintained with 15 minute checks. Will continue to monitor.   Problem: Safety: Goal: Ability to identify and utilize support systems that promote safety will improve Outcome: Progressing  Patient is able to verbalize understanding of reaching out to staff if he feels like hurting himself or suicidal.

## 2018-03-26 NOTE — Progress Notes (Signed)
Patient ID: Rick Bray, male   DOB: 06/11/1986, 32 y.o.   MRN: [id number]  Admission Note  Pt is a 32 y/o male admitted onto the 300 I/P adult unit on a voluntary basis. On admission, Pt appear to be anxious however; cooperative. Pt endorsed moderate anxiety, depression, hopelessness and passive SI with a plan to cut-Pt has many fresh and healed cuts on both arms. "Cutting help me cope better with life." Pt denied AVH or pain at this time. Pt also denied recent alcohol use. Pt at this time is homeless: "I [place] living [place] the woods." Support, encouragement, and safe environment provided.  15-minute safety checks initiated and continued. Pt remained cooperative through the admission process. Pt remained alert and oriented through the admission process. Pt searched and no contrabands found. Pt body is covered with several tattoos and several ould and fresh scars.
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
## 2018-03-26 NOTE — BHH Counselor (Signed)
Adult Comprehensive Assessment  Patient ID: Rick Bray, male   DOB: 1985/11/10, 32 y.o.   MRN: [id number]  Information Source: Information source: Patient  Current Stressors:  Patient states their primary concerns and needs for treatment are:: "A structured environment" Patient states their goals for this hospitilization and ongoing recovery are:: "To get somewhere I can live a sober life." Educational / Learning stressors: Denies stressors Employment / Job issues: Denies stressors Family Relationships: Has lost family members and does not have a relationship with remaining family.  Has not seen 3 sons in 4 years. Financial / Lack of resources (include bankruptcy): Supposed to be on disability, which was cut off when he went to prison, and has not been restarted since he got out in November 2016.  He has applied, and the decision date is coming up June 4th. Housing / Lack of housing: Has no money to pay for housing.  Has been staying on the streets. Physical health (include injuries & life threatening diseases): Denies stressors. Social relationships: Anger issues with girlforend. Substance abuse: Affects his whole life. Bereavement / Loss: Stepbrother hung himself in 01-31-11.  Living/Environment/Situation:  Living Arrangements: Other (Comment) Living conditions (as described by patient or guardian): Homeless, staying on the streets Who else lives in the home?: Nobody How long has patient lived in current situation?: Almost 5 months What is atmosphere in current home: Chaotic, Temporary, Dangerous  Family History:  Marital status: Long term relationship Long term relationship, how long?: 3 months What types of issues is patient dealing with in the relationship?: Anger, and when he tries to tell her how he feels, she gets even angrier Are you sexually active?: Yes What is your sexual orientation?: Heterosexual Has your sexual activity been affected by drugs, alcohol, medication, or  emotional stress?: None Does patient have children?: Yes How many children?: 3 How is patient's relationship with their children?: 69yo, 11yo, and 8yo - has no relationship with any of them  Childhood History:  By whom was/is the patient raised?: Grandparents Additional childhood history information: Mother was an alcoholic.  Father had Schizophrenia.  Therefore, was raised by paternal grandmother. Description of patient's relationship with caregiver when they were a child: Grandmother - Good relationship, very caring.  Mother - would see her when she was not drunk.  Father - no relationship Patient's description of current relationship with people who raised him/her: Grandmother - deceased, Mother - deceased, Father - stil living, but no relationship How were you disciplined when you got in trouble as a child/adolescent?: "I wasn't.  I should have been disciplined more." Does patient have siblings?: Yes Number of Siblings: 2 Description of patient's current relationship with siblings: Half-sister (very little contact), step-brother (deceased by suicide in 31-Jan-2011, had a good relationship but they did drugs together, then he hung himself) Did patient suffer any verbal/emotional/physical/sexual abuse as a child?: No Did patient suffer from severe childhood neglect?: No Has patient ever been sexually abused/assaulted/raped as an adolescent or adult?: No Was the patient ever a victim of a crime or a disaster?: No Witnessed domestic violence?: No Has patient been effected by domestic violence as an adult?: No  Education:  Highest grade of school patient has completed: 9th grade Currently a student?: No Learning disability?: Yes What learning problems does patient have?: ADHD  Employment/Work Situation:   Employment situation: Unemployed(Previously had disability for Schizoaffective/Depressed type, is awaiting 03/29/18 decision on his new application.) What is the longest time patient has a held a  job?: 4  years Where was the patient employed at that time?: [employer] Are There Guns or Other Weapons in [place]?: No  Financial Resources:   Financial resources: No income(No insurance) Does patient have a Programmer, applications or guardian?: No  Alcohol/Substance Abuse:   What has been your use of drugs/alcohol within the last 12 months?: Methamphetamine (daily), marijuana (daily), heroin (about 1 week only), alcohol (just when coming off meth) Alcohol/Substance Abuse Treatment Hx: Past Tx, Inpatient If yes, describe treatment: Inpatient in [place], has been to AA/NA in the past Has alcohol/substance abuse ever caused legal problems?: No  Social Support System:   Heritage manager System: None Describe Community Support System: N/A Type of faith/religion: Darrick Meigs How does patient's faith help to cope with current illness?: "I don't."  Leisure/Recreation:   Leisure and Hobbies: Listen to music  Strengths/Needs:   What is the patient's perception of their strengths?: Good daddy when he has his kids, did not do drugs except smoke a blunt of marijuana.  "When they left I gave up."   Patient states they can use these personal strengths during their treatment to contribute to their recovery: "I can't.  They're not there."   Patient states these barriers may affect/interfere with their treatment: Homelessness Patient states these barriers may affect their return to the community: Homelessness Other important information patient would like considered in planning for their treatment: "I need to go to a drug treatment place."  Discharge Plan:   Currently receiving community mental health services: No Patient states concerns and preferences for aftercare planning are: States he would like to go to [facility] [place], have psychiatric follow-up and live in a recovery house. Patient states they will know when they are safe and ready for discharge when: "When I have a  structured environment to go to and I know I can be off drugs." Does patient have access to transportation?: No Does patient have financial barriers related to discharge medications?: Yes Patient description of barriers related to discharge medications: No income, no insurance Plan for no access to transportation at discharge: Transportation is not available, will need to be explored. Plan for living situation after discharge: Would like to go to a recovery home, also has mentioned drug rehab. Will patient be returning to same living situation after discharge?: No  Summary/Recommendations:   Summary and Recommendations (to be completed by the evaluator): Patient is a 32yo male admitted with suicidal ideation with a plan to overdose on drugs or cut himself.  He also states he has problematic drug use including methamphetamines and marijuana daily, heroin for 1 week recently, and alcohol at times to come down from the meth.  He reports that he holds his feelings inside until he explodes, and that will result in self-injury by cutting, with numerous scars covering both forearms.  Primary stressors include not seeing his three sons in 4 years due to conflict with their mothers, homelessness, having no income since his disability was cut off while he was in prison, and having no support system.  Patient will benefit from crisis stabilization, medication evaluation, group therapy and psychoeducation, in addition to case management for discharge planning. At discharge it is recommended that Patient adhere to the established discharge plan and continue in treatment.  Maretta Los. 03/26/2018

## 2018-03-26 NOTE — ED Notes (Signed)
Patient continues to endorse SI with a plan to cut himself. Patient denies HI/AVH at this time. Plan of care discussed. Encouragement and support provided and safety maintain. Q 15 min safety checks in place and video monitoring.

## 2018-03-26 NOTE — Progress Notes (Signed)
Patient did not attend the evening speaker AA meeting. Pt was notified that group was beginning but remained in bed.   

## 2018-03-26 NOTE — Progress Notes (Signed)
Pt did not attend goals/ orientation group this morning. Pt has been sleep most of the morning

## 2018-03-26 NOTE — BHH Group Notes (Signed)
Recreation Activity  Date:  03/26/2018  Time:  6:06 PM  Type of Therapy:  BINGO  :  The purpose of the group is to create a forum where the patients   Participation Level:  Did Not Attend  Participation Quality:    Affect:    Cognitive:    Insight:    Engagement in Group:    Modes of Intervention:    Summary of Progress/Problems:  Lauralyn Primes 03/26/2018, 6:06 PM

## 2018-03-26 NOTE — BHH Group Notes (Signed)
Identifying Needs   Date:  03/26/2018  Time:  4:05 PM  Type of Therapy:  Nurse Education  /  The group focuses on teaching patients how to identify their needs and then how to develop the coping skills needed to get some of those needs met.  Participation Level:  Patient did not attend.  Participation Quality:    Affect:    Cognitive:    Insight:    Engagement in Group:    Modes of Intervention:    Summary of Progress/Problems:  Lauralyn Primes 03/26/2018, 4:05 PM

## 2018-03-26 NOTE — ED Notes (Signed)
Pt transported to [facility] by [facility] transportation service for continuation of specialized care. Belongings given to driver after patient refused to signed for them. Pt left in no acute distress.

## 2018-03-26 NOTE — Tx Team (Signed)
Initial Treatment Plan 03/26/2018 3:20 AM [name] ITJ:[id number]    PATIENT STRESSORS: Financial difficulties Marital or family conflict Occupational concerns Substance abuse Traumatic event   PATIENT STRENGTHS: Communication skills Physical Health   PATIENT IDENTIFIED PROBLEMS: "I still feel like cutting"  "I have no one"  Depression  Anxiety  Risk for suiide  Self injury  Substance Abuse         DISCHARGE CRITERIA:  Ability to meet basic life and health needs Adequate post-discharge living arrangements Verbal commitment to aftercare and medication compliance Withdrawal symptoms are absent or subacute and managed without 24-hour nursing intervention  PRELIMINARY DISCHARGE PLAN: Outpatient therapy Placement in alternative living arrangements  PATIENT/FAMILY INVOLVEMENT: This treatment plan has been presented to and reviewed with the patient, Rick Bray, and/or family member.  The patient and family have been given the opportunity to ask questions and make suggestions.  Leia Alf, RN 03/26/2018, 3:20 AM

## 2018-03-27 LAB — HEMOGLOBIN A1C
Hgb A1c MFr Bld: 5.2 % (ref 4.8–5.6)
MEAN PLASMA GLUCOSE: 102.54 mg/dL

## 2018-03-27 LAB — LIPID PANEL
CHOLESTEROL: 128 mg/dL (ref 0–200)
HDL: 49 mg/dL (ref >40)
LDL Cholesterol: 69 mg/dL (ref 0–99)
Total CHOL/HDL Ratio: 2.6 RATIO
Triglycerides: 52 mg/dL (ref <150)
VLDL: 10 mg/dL (ref 0–40)

## 2018-03-27 MED ORDER — NICOTINE 21 MG/24HR TD PT24
21.0000 mg | MEDICATED_PATCH | Freq: Every day | TRANSDERMAL | Status: DC
  Administered 2018-03-27 – 2018-03-29 (×3): 21 mg via TRANSDERMAL
  Filled 2018-03-27 (×6): qty 1

## 2018-03-27 MED ORDER — ENSURE ENLIVE PO LIQD
237.0000 mL | Freq: Two times a day (BID) | ORAL | Status: DC
  Administered 2018-03-27 – 2018-03-31 (×9): 237 mL via ORAL

## 2018-03-27 NOTE — Progress Notes (Signed)
D:  Rick Bray has been in his room much of the shift.  He did not attend evening AA group.  He reported that he is just tired and wants to sleep.  He did get up for evening snack.  He took his hs trazodone but was asleep for his repeat dose.  He denied SI/HI or A/V hallucinations. A:  1:1 with RN for support and encouragement.  Medications as ordered.  Q 15 minute checks maintained for safety.  Encouraged participation in group and unit activities. R:  Kashif remains safe on the unit.  We will continue to monitor the progress towards his goals.

## 2018-03-27 NOTE — Progress Notes (Signed)
Pt did not attend goals and orientation group this morning. Pt has been sleep most of the morning

## 2018-03-27 NOTE — Progress Notes (Signed)
[facility] MD Progress Note  03/27/2018 1:32 PM Rick Bray  MRN:  [id number] Subjective: Reports he is feeling better than he did before admission.  Denies medication side effects.  Denies suicidal ideations at this time. Objective: I have reviewed chart notes and have met with patient. 32 year old single male, currently homeless, presented due to worsening depression and suicidal ideations.  History of methamphetamine dependence. Today reports partial improvement compared to admission.  He remains depressed and somewhat dysphoric but to a lesser degree than on admission.  Affect improves partially during session and smiles at times appropriately.  Denies suicidal ideations at this time.  Presents future oriented expressing interest in going to a rehab when discharged. Currently on Abilify and Celexa -denies side effects thus far .  Limited group participation.  No disruptive or agitated behaviors. Labs reviewed- HgbA1c 5.2, Lipid panel unremarkable. Principal Problem: Substance-induced mood disorder, stimulant dependence  Diagnosis:   Patient Active Problem List   Diagnosis Date Noted  . MDD (major depressive disorder), recurrent episode, severe ([place]) [F33.2] 03/26/2018   Total Time spent with patient: 15 minutes  Past Psychiatric History:   Past Medical History:  Past Medical History:  Diagnosis Date  . Leukemia in remission [facility])    History reviewed. No pertinent surgical history. Family History: History reviewed. No pertinent family history. Family Psychiatric  History: Social History:  Social History   Substance and Sexual Activity  Alcohol Use Yes     Social History   Substance and Sexual Activity  Drug Use Yes  . Types: Marijuana, Methamphetamines    Social History   Socioeconomic History  . Marital status: Single    Spouse name: Not on file  . Number of children: Not on file  . Years of education: Not on file  . Highest education level: Not on file  Occupational  History  . Not on file  Social Needs  . Financial resource strain: Not on file  . Food insecurity:    Worry: Not on file    Inability: Not on file  . Transportation needs:    Medical: Not on file    Non-medical: Not on file  Tobacco Use  . Smoking status: Current Every Day Smoker    Packs/day: 1.00    Years: 15.00    Pack years: 15.00    Types: Cigarettes  . Smokeless tobacco: Never Used  Substance and Sexual Activity  . Alcohol use: Yes  . Drug use: Yes    Types: Marijuana, Methamphetamines  . Sexual activity: Not on file  Lifestyle  . Physical activity:    Days per week: Not on file    Minutes per session: Not on file  . Stress: Not on file  Relationships  . Social connections:    Talks on phone: Not on file    Gets together: Not on file    Attends religious service: Not on file    Active member of club or organization: Not on file    Attends meetings of clubs or organizations: Not on file    Relationship status: Not on file  Other Topics Concern  . Not on file  Social History Narrative  . Not on file   Additional Social History:    Pain Medications: None Prescriptions: See MAR Over the Counter: None History of alcohol / drug use?: Yes Longest period of sobriety (when/how long): unknown Negative Consequences of Use: Financial, Personal relationships Name of Substance 1: Meth.  1 - Age of First Use: Pt reported,  16.  1 - Amount (size/oz): Pt reported, snorting half a gram, yesterday.  1 - Duration: Ongoing.  1 - Last Use / Amount: Pt reported, yesterday. Name of Substance 2: Marijuana.  2 - Age of First Use: UTA 2 - Amount (size/oz): Pt reported, smoking a blunt, yesterday.  2 - Frequency: UTA 2 - Duration: UTA 2 - Last Use / Amount: Pt reported, yesterday.  Sleep: Fair- improving   Appetite:  Fair- improving   Current Medications: Current Facility-Administered Medications  Medication Dose Route Frequency Provider Last Rate Last Dose  .  acetaminophen (TYLENOL) tablet 650 mg  650 mg Oral Q6H PRN Laverle Hobby, PA-C      . alum & mag hydroxide-simeth (MAALOX/MYLANTA) 200-200-20 MG/5ML suspension 30 mL  30 mL Oral Q4H PRN Patriciaann Clan E, PA-C      . ARIPiprazole (ABILIFY) tablet 5 mg  5 mg Oral Daily Eryck Negron, Myer Peer, MD   5 mg at 03/27/18 1033  . citalopram (CELEXA) tablet 10 mg  10 mg Oral Daily Anja Neuzil, Myer Peer, MD   10 mg at 03/27/18 1029  . feeding supplement (ENSURE ENLIVE) (ENSURE ENLIVE) liquid 237 mL  237 mL Oral BID BM Sharma Covert, MD   237 mL at 03/27/18 1033  . hydrOXYzine (ATARAX/VISTARIL) tablet 25 mg  25 mg Oral Q6H PRN Patriciaann Clan E, PA-C      . magnesium hydroxide (MILK OF MAGNESIA) suspension 30 mL  30 mL Oral Daily PRN Patriciaann Clan E, PA-C      . nicotine (NICODERM CQ - dosed in mg/24 hours) patch 21 mg  21 mg Transdermal Daily Collin Rengel, Myer Peer, MD   21 mg at 03/27/18 1033  . traZODone (DESYREL) tablet 50 mg  50 mg Oral QHS,MR X 1 Laverle Hobby, PA-C   50 mg at 03/26/18 2154    Lab Results:  Results for orders placed or performed during the hospital encounter of 03/26/18 (from the past 48 hour(s))  Hemoglobin A1c     Status: None   Collection Time: 03/27/18  6:21 AM  Result Value Ref Range   Hgb A1c MFr Bld 5.2 4.8 - 5.6 %    Comment: (NOTE) Pre diabetes:          5.7%-6.4% Diabetes:              >6.4% Glycemic control for   <7.0% adults with diabetes    Mean Plasma Glucose 102.54 mg/dL    Comment: Performed at [facility], [place] [address] [place], [place] [address]  Lipid panel     Status: None   Collection Time: 03/27/18  6:21 AM  Result Value Ref Range   Cholesterol 128 0 - 200 mg/dL   Triglycerides 52 <150 mg/dL   HDL 49 >40 mg/dL   Total CHOL/HDL Ratio 2.6 RATIO   VLDL 10 0 - 40 mg/dL   LDL Cholesterol 69 0 - 99 mg/dL    Comment:        Total Cholesterol/HDL:CHD Risk Coronary Heart Disease Risk Table                     Men   Women  1/2 Average Risk   3.4    3.3  Average Risk       5.0   4.4  2 X Average Risk   9.6   7.1  3 X Average Risk  23.4   11.0  Use the calculated Patient Ratio above and the CHD Risk Table to determine the patient's CHD Risk.        ATP III CLASSIFICATION (LDL):  <100     mg/dL   Optimal  100-129  mg/dL   Near or Above                    Optimal  130-159  mg/dL   Borderline  160-189  mg/dL   High  >190     mg/dL   Very High Performed at [facility] [address] [place], [place] [address]     Blood Alcohol level:  Lab Results  Component Value Date   ETH <10 67/59/1638    Metabolic Disorder Labs: Lab Results  Component Value Date   HGBA1C 5.2 03/27/2018   MPG 102.54 03/27/2018   No results found for: PROLACTIN Lab Results  Component Value Date   CHOL 128 03/27/2018   TRIG 52 03/27/2018   HDL 49 03/27/2018   CHOLHDL 2.6 03/27/2018   VLDL 10 03/27/2018   LDLCALC 69 03/27/2018    Physical Findings: AIMS: Facial and Oral Movements Muscles of Facial Expression: None, normal Lips and Perioral Area: None, normal Jaw: None, normal Tongue: None, normal,Extremity Movements Upper (arms, wrists, hands, fingers): None, normal Lower (legs, knees, ankles, toes): None, normal, Trunk Movements Neck, shoulders, hips: None, normal, Overall Severity Severity of abnormal movements (highest score from questions above): None, normal Incapacitation due to abnormal movements: None, normal Patient's awareness of abnormal movements (rate only patient's report): No Awareness, Dental Status Current problems with teeth and/or dentures?: No Does patient usually wear dentures?: No  CIWA:    COWS:     Musculoskeletal: Strength & Muscle Tone: within normal limits Gait & Station: normal Patient leans: N/A  Psychiatric Specialty Exam: Physical Exam  ROS denies headache, no chest pain, no shortness of breath, no nausea or vomiting  Blood pressure 125/86, pulse 83, temperature 99.1 F  (37.3 C), temperature source Oral, resp. rate 20, height 5' 6.14" (1.68 m), weight 55.8 kg (123 lb), SpO2 99 %.Body mass index is 19.77 kg/m.  General Appearance: Fairly Groomed  Eye Contact:  Fair  Speech:  Normal Rate  Volume:  Normal  Mood:  remains depressed/dysphoric, but reports mood better than on admission  Affect:  Still constricted but smiles briefly at times  Thought Process:  Linear and Descriptions of Associations: Intact  Orientation:  Other:  Fully alert and attentive  Thought Content:  Denies hallucinations, no delusions expressed.  Does not present internally preoccupied  Suicidal Thoughts:  No denies suicidal ideations, no self-injurious behaviors /at this time contracts for safety on unit   Homicidal Thoughts:  No  Memory:  Recent and remote grossly intact  Judgement:  Fair  Insight:  Fair  Psychomotor Activity:  Decreased  Concentration:  Concentration: Fair and Attention Span: Fair  Recall:  [employer] of Knowledge:  Fair  Language:  Fair  Akathisia:  Negative  Handed:  Right  AIMS (if indicated):     Assets:  Desire for Improvement Resilience  ADL's:  Intact  Cognition:  WNL  Sleep:  Number of Hours: 6   Assessment -patient reports partially improved mood compared to how he felt prior to admission.  He does remain depressed and vaguely dysphoric but affect is noted to be more reactive.  Denies suicidal ideations. He is future oriented, expressing interest in going to a rehab at discharge.  Currently tolerating  medications well  Treatment Plan Summary: Daily contact with patient to assess and evaluate symptoms and progress in treatment, Medication management, Plan Inpatient treatment and Medications as below Encourage group and milieu participation to work on coping skills and symptom reduction Encourage efforts to work on sobriety and relapse prevention Treatment team working on disposition planning Continue Celexa 10 mg a day for depression Continue  Abilify 5 mg a day for mood disorder Continue trazodone 50 mg nightly PRN for insomnia as needed Continue Vistaril 25 mg every 6 hours PRN for anxiety as needed  Jenne Campus, MD 03/27/2018, 1:32 PM

## 2018-03-27 NOTE — Progress Notes (Signed)
Date:  03/27/2018  Time: 03:00  Type of Therapy:  Psychoeducational Skills  Participation Level:  Did Not Attend  Participation Quality:  Affect:    Cognitive:   Insight:   Engagement in Group:    Modes of Intervention:  Activity, Discussion and Education  Summary of Progress/Problems: Pt chose to not attend group this afternoon.  Elenore Rota 03/27/2018, 3:35 PM

## 2018-03-27 NOTE — Progress Notes (Signed)
Patient did attend the evening speaker AA meeting.  

## 2018-03-27 NOTE — Plan of Care (Signed)
Patient self inventory: Patient slept well last night, sleep medications helped. Appetite has been good, energy level low. Depression rated 6 out of 10, hopelessness 7 or 8 out of 10, and anxiety 4 out of 10. Patient is experiencing agitation. Denies SI/HI/AVH. Patient's goal for the day "getting a plan on discharge."  Problem: Activity: Goal: Imbalance in normal sleep/wake cycle will improve Outcome: Not Progressing  Patient remains in bed asleep all day.

## 2018-03-27 NOTE — Plan of Care (Signed)
Rick Bray has been in his room much of the evening.  He didn't attend evening group and just reported that he is really tired.  Encouraged participation in group and unit activities.

## 2018-03-27 NOTE — BHH Group Notes (Signed)
[facility] Group Notes: (Clinical Social Work)   03/27/2018      Type of Therapy:  Group Therapy   Participation Level:  Did Not Attend despite MHT prompting   Selmer Dominion, LCSW 03/27/2018, 12:32 PM

## 2018-03-27 NOTE — Progress Notes (Signed)
NUTRITION ASSESSMENT  Pt identified as at risk on the Malnutrition Screen Tool  INTERVENTION: 1. Supplements: Ensure Enlive po BID, each supplement provides 350 kcal and 20 grams of protein  NUTRITION DIAGNOSIS: Unintentional weight loss related to sub-optimal intake as evidenced by pt report.   Goal: Pt to meet >/= 90% of their estimated nutrition needs.  Monitor:  PO intake  Assessment:  Pt admitted for depression. Pt currently homeless, states [place]. Suspect poor access to food, will provide Ensure supplements.   Height: Ht Readings from Last 1 Encounters:  03/26/18 5' 6.14" (1.68 m)    Weight: Wt Readings from Last 1 Encounters:  03/26/18 123 lb (55.8 kg)    Weight Hx: Wt Readings from Last 10 Encounters:  03/26/18 123 lb (55.8 kg)  03/25/18 130 lb (59 kg)    BMI:  Body mass index is 19.77 kg/m. Pt meets criteria for normal based on current BMI.  Estimated Nutritional Needs: Kcal: 25-30 kcal/kg Protein: > 1 gram protein/kg Fluid: 1 ml/kcal  Diet Order:  Diet Order           Diet regular Room service appropriate? Yes; Fluid consistency: Thin  Diet effective now         Pt is also offered choice of unit snacks mid-morning and mid-afternoon.  Pt is eating as desired.   Lab results and medications reviewed.   Clayton Bibles, MS, RD, [place] Dietitian Pager: [phone] After Hours Pager: [phone]

## 2018-03-28 DIAGNOSIS — F1910 Other psychoactive substance abuse, uncomplicated: Secondary | ICD-10-CM

## 2018-03-28 DIAGNOSIS — F419 Anxiety disorder, unspecified: Secondary | ICD-10-CM

## 2018-03-28 NOTE — Progress Notes (Addendum)
[facility] MD Progress Note  03/28/2018 2:58 PM Rick Bray  MRN:  [id number]   Subjective: Patient reports today that he is still feeling very depressed today.  He denies any SI/HI/AVH and contracts for safety.  He denies any medication side effects and then also states that he does not think that medications will assist him with his problems.  He states all of his problems at home will still be there when he leaves.  However patient had refused to attend groups and was lying in his bed.  Objective: Patient's chart and findings reviewed and discussed with treatment team.  Patient presents lying in his bed in his room but is awake.  He is cooperative.  Patient has not been attending groups or interacting with peers or staff.  However does make comments that he does not think medications will help him but is not working on any of the other options to improve his mood or his outcome with this day.  Encouraged patient to go to groups and to have one-to-one conversations with peers and staff and will suggest that patient be seen by peers support specialist.   Principal Problem: MDD (major depressive disorder), recurrent episode, severe ([place]) Diagnosis:   Patient Active Problem List   Diagnosis Date Noted  . MDD (major depressive disorder), recurrent episode, severe ([place]) [F33.2] 03/26/2018   Total Time spent with patient: 20 minutes  Past Psychiatric History: See H&P  Past Medical History:  Past Medical History:  Diagnosis Date  . Leukemia in remission [facility])    History reviewed. No pertinent surgical history. Family History: History reviewed. No pertinent family history. Family Psychiatric  History: See H&P Social History:  Social History   Substance and Sexual Activity  Alcohol Use Yes     Social History   Substance and Sexual Activity  Drug Use Yes  . Types: Marijuana, Methamphetamines    Social History   Socioeconomic History  . Marital status: Single    Spouse name: Not on file  .  Number of children: Not on file  . Years of education: Not on file  . Highest education level: Not on file  Occupational History  . Not on file  Social Needs  . Financial resource strain: Not on file  . Food insecurity:    Worry: Not on file    Inability: Not on file  . Transportation needs:    Medical: Not on file    Non-medical: Not on file  Tobacco Use  . Smoking status: Current Every Day Smoker    Packs/day: 1.00    Years: 15.00    Pack years: 15.00    Types: Cigarettes  . Smokeless tobacco: Never Used  Substance and Sexual Activity  . Alcohol use: Yes  . Drug use: Yes    Types: Marijuana, Methamphetamines  . Sexual activity: Not on file  Lifestyle  . Physical activity:    Days per week: Not on file    Minutes per session: Not on file  . Stress: Not on file  Relationships  . Social connections:    Talks on phone: Not on file    Gets together: Not on file    Attends religious service: Not on file    Active member of club or organization: Not on file    Attends meetings of clubs or organizations: Not on file    Relationship status: Not on file  Other Topics Concern  . Not on file  Social History Narrative  . Not on  file   Additional Social History:    Pain Medications: None Prescriptions: See MAR Over the Counter: None History of alcohol / drug use?: Yes Longest period of sobriety (when/how long): unknown Negative Consequences of Use: Financial, Personal relationships Name of Substance 1: Meth.  1 - Age of First Use: Pt reported, 16.  1 - Amount (size/oz): Pt reported, snorting half a gram, yesterday.  1 - Duration: Ongoing.  1 - Last Use / Amount: Pt reported, yesterday. Name of Substance 2: Marijuana.  2 - Age of First Use: UTA 2 - Amount (size/oz): Pt reported, smoking a blunt, yesterday.  2 - Frequency: UTA 2 - Duration: UTA 2 - Last Use / Amount: Pt reported, yesterday.                Sleep: Good  Appetite:  Good  Current  Medications: Current Facility-Administered Medications  Medication Dose Route Frequency Provider Last Rate Last Dose  . acetaminophen (TYLENOL) tablet 650 mg  650 mg Oral Q6H PRN Laverle Hobby, PA-C      . alum & mag hydroxide-simeth (MAALOX/MYLANTA) 200-200-20 MG/5ML suspension 30 mL  30 mL Oral Q4H PRN Patriciaann Clan E, PA-C      . ARIPiprazole (ABILIFY) tablet 5 mg  5 mg Oral Daily Cobos, Myer Peer, MD   5 mg at 03/28/18 0757  . citalopram (CELEXA) tablet 10 mg  10 mg Oral Daily Cobos, Myer Peer, MD   10 mg at 03/28/18 0757  . feeding supplement (ENSURE ENLIVE) (ENSURE ENLIVE) liquid 237 mL  237 mL Oral BID BM Sharma Covert, MD   237 mL at 03/28/18 1353  . hydrOXYzine (ATARAX/VISTARIL) tablet 25 mg  25 mg Oral Q6H PRN Laverle Hobby, PA-C   25 mg at 03/28/18 1324  . magnesium hydroxide (MILK OF MAGNESIA) suspension 30 mL  30 mL Oral Daily PRN Patriciaann Clan E, PA-C      . nicotine (NICODERM CQ - dosed in mg/24 hours) patch 21 mg  21 mg Transdermal Daily Cobos, Myer Peer, MD   21 mg at 03/28/18 0759  . traZODone (DESYREL) tablet 50 mg  50 mg Oral QHS,MR X 1 Laverle Hobby, PA-C   50 mg at 03/27/18 2131    Lab Results:  Results for orders placed or performed during the hospital encounter of 03/26/18 (from the past 48 hour(s))  Hemoglobin A1c     Status: None   Collection Time: 03/27/18  6:21 AM  Result Value Ref Range   Hgb A1c MFr Bld 5.2 4.8 - 5.6 %    Comment: (NOTE) Pre diabetes:          5.7%-6.4% Diabetes:              >6.4% Glycemic control for   <7.0% adults with diabetes    Mean Plasma Glucose 102.54 mg/dL    Comment: Performed at [facility], [place] [address], [place],  [address]  Lipid panel     Status: None   Collection Time: 03/27/18  6:21 AM  Result Value Ref Range   Cholesterol 128 0 - 200 mg/dL   Triglycerides 52 <150 mg/dL   HDL 49 >40 mg/dL   Total CHOL/HDL Ratio 2.6 RATIO   VLDL 10 0 - 40 mg/dL   LDL Cholesterol 69 0 - 99 mg/dL     Comment:        Total Cholesterol/HDL:CHD Risk Coronary Heart Disease Risk Table  Men   Women  1/2 Average Risk   3.4   3.3  Average Risk       5.0   4.4  2 X Average Risk   9.6   7.1  3 X Average Risk  23.4   11.0        Use the calculated Patient Ratio above and the CHD Risk Table to determine the patient's CHD Risk.        ATP III CLASSIFICATION (LDL):  <100     mg/dL   Optimal  100-129  mg/dL   Near or Above                    Optimal  130-159  mg/dL   Borderline  160-189  mg/dL   High  >190     mg/dL   Very High Performed at [facility] [address], [place], [place] [address]     Blood Alcohol level:  Lab Results  Component Value Date   ETH <10 58/85/0277    Metabolic Disorder Labs: Lab Results  Component Value Date   HGBA1C 5.2 03/27/2018   MPG 102.54 03/27/2018   No results found for: PROLACTIN Lab Results  Component Value Date   CHOL 128 03/27/2018   TRIG 52 03/27/2018   HDL 49 03/27/2018   CHOLHDL 2.6 03/27/2018   VLDL 10 03/27/2018   LDLCALC 69 03/27/2018    Physical Findings: AIMS: Facial and Oral Movements Muscles of Facial Expression: None, normal Lips and Perioral Area: None, normal Jaw: None, normal Tongue: None, normal,Extremity Movements Upper (arms, wrists, hands, fingers): None, normal Lower (legs, knees, ankles, toes): None, normal, Trunk Movements Neck, shoulders, hips: None, normal, Overall Severity Severity of abnormal movements (highest score from questions above): None, normal Incapacitation due to abnormal movements: None, normal Patient's awareness of abnormal movements (rate only patient's report): No Awareness, Dental Status Current problems with teeth and/or dentures?: No Does patient usually wear dentures?: No  CIWA:    COWS:     Musculoskeletal: Strength & Muscle Tone: within normal limits Gait & Station: normal Patient leans: N/A  Psychiatric Specialty Exam: Physical Exam   Nursing note and vitals reviewed. Constitutional: He is oriented to person, place, and time. He appears well-developed and well-nourished.  Cardiovascular: Normal rate.  Respiratory: Effort normal.  Musculoskeletal: Normal range of motion.  Neurological: He is alert and oriented to person, place, and time.  Skin: Skin is warm.    Review of Systems  Constitutional: Negative.   HENT: Negative.   Eyes: Negative.   Respiratory: Negative.   Cardiovascular: Negative.   Gastrointestinal: Negative.   Genitourinary: Negative.   Musculoskeletal: Negative.   Skin: Negative.   Neurological: Negative.   Endo/Heme/Allergies: Negative.   Psychiatric/Behavioral: Positive for depression and substance abuse. Negative for hallucinations and suicidal ideas.    Blood pressure 128/90, pulse (!) 104, temperature 98.5 F (36.9 C), temperature source Oral, resp. rate 18, height 5' 6.14" (1.68 m), weight 55.8 kg (123 lb), SpO2 99 %.Body mass index is 19.77 kg/m.  General Appearance: Casual  Eye Contact:  Good  Speech:  Clear and Coherent and Normal Rate  Volume:  Decreased  Mood:  Depressed  Affect:  Flat  Thought Process:  Linear and Descriptions of Associations: Intact  Orientation:  Full (Time, Place, and Person)  Thought Content:  WDL  Suicidal Thoughts:  No  Homicidal Thoughts:  No  Memory:  Immediate;   Good Recent;   Good Remote;  Good  Judgement:  Fair  Insight:  Good and Fair  Psychomotor Activity:  Normal  Concentration:  Concentration: Good and Attention Span: Good  Recall:  Good  Fund of Knowledge:  Good  Language:  Good  Akathisia:  No  Handed:  Right  AIMS (if indicated):     Assets:  Communication Skills Desire for Improvement Resilience  ADL's:  Intact  Cognition:  WNL  Sleep:  Number of Hours: 4.25   Problems addressed MDD severe recurrent  Treatment Plan Summary: Daily contact with patient to assess and evaluate symptoms and progress in treatment, Medication  management and Plan is to: -Continue Abilify 5 mg p.o. daily for mood stability -Continue Celexa 10 mg p.o. daily for mood stability -Continue Vistaril 25 mg p.o. every 6 hours hours as needed for anxiety -Continue trazodone 50 mg p.o. nightly as needed for insomnia -Encourage group therapy participation  Lewis Shock, FNP 03/28/2018, 2:58 PM   ..Agree with NP Progress Note

## 2018-03-28 NOTE — Progress Notes (Cosign Needed)
DAR NOTE: Pt present with flat affect and depressed mood in the unit. Pt has been isolating himself and has been bed most of the time. Pt denies physical pain, took all his meds as scheduled. As per self inventory, pt had a good night sleep, good appetite, normal energy, and good concentration. Pt rate depression at 6, hopeless ness at 7, and anxiety at 5. Pt's safety ensured with 15 minute and environmental checks. Pt currently denies SI/HI and A/V hallucinations. Pt verbally agrees to seek staff if SI/HI or A/VH occurs and to consult with staff before acting on these thoughts. Will continue POC.

## 2018-03-28 NOTE — Plan of Care (Signed)
Patient continues to isolate to his bed, room and is not active in the milieu. Patient states he went to a group today however review of chart indicates otherwise.

## 2018-03-28 NOTE — [facility] Group Notes (Signed)
LCSW Group Therapy Note   03/28/2018 1:15pm   Type of Therapy and Topic:  Group Therapy:  Overcoming Obstacles   Participation Level:  Did Not Attend--pt invited. Chose to remain in bed.    Description of Group:    In this group patients will be encouraged to explore what they see as obstacles to their own wellness and recovery. They will be guided to discuss their thoughts, feelings, and behaviors related to these obstacles. The group will process together ways to cope with barriers, with attention given to specific choices patients can make. Each patient will be challenged to identify changes they are motivated to make in order to overcome their obstacles. This group will be process-oriented, with patients participating in exploration of their own experiences as well as giving and receiving support and challenge from other group members.   Therapeutic Goals: 1. Patient will identify personal and current obstacles as they relate to admission. 2. Patient will identify barriers that currently interfere with their wellness or overcoming obstacles.  3. Patient will identify feelings, thought process and behaviors related to these barriers. 4. Patient will identify two changes they are willing to make to overcome these obstacles:      Summary of Patient Progress   x   Therapeutic Modalities:   Cognitive Behavioral Therapy Solution Focused Therapy Motivational Interviewing Relapse Prevention Therapy  Rick Bray, [place] 03/28/2018 4:04 PM

## 2018-03-28 NOTE — Progress Notes (Signed)
Recreation Therapy Notes  Date: 6.3.19 Time: 0930 Location: 300 [address] Dayroom  Group Topic: Stress Management  Goal Area(s) Addresses:  Patient will verbalize importance of using healthy stress management.  Patient will identify positive emotions associated with healthy stress management.   Intervention: Stress Management  Activity :  Body Scan Meditation.  LRT introduced the stress management technique of meditation.  LRT played a meditation that focused on scanning the body for any feelings or sensations they may be feeling.  Patients were to follow along as meditation played.  Education:  Stress Management, Discharge Planning.   Education Outcome: Acknowledges edcuation/In group clarification offered/Needs additional education  Clinical Observations/Feedback: Pt did not attend group.    Victorino Sparrow, LRT/CTRS         Victorino Sparrow A 03/28/2018 11:27 AM

## 2018-03-28 NOTE — Progress Notes (Signed)
Psychoeducational Group Note  Date:  03/28/2018 Time:  2228  Group Topic/Focus:  Wrap-Up Group:   The focus of this group is to help patients review their daily goal of treatment and discuss progress on daily workbooks.  Participation Level: Did Not Attend  Participation Quality:  Not Applicable  Affect:  Not Applicable  Cognitive:  Not Applicable  Insight:  Not Applicable  Engagement in Group: Not Applicable  Additional Comments:  The patient did not attend group this evening since he was asleep in his bedroom.   Archie Balboa S 03/28/2018, 10:28 PM

## 2018-03-28 NOTE — Tx Team (Signed)
Interdisciplinary Treatment and Diagnostic Plan Update  03/28/2018 Time of Session: 0830AM Rick Bray MRN: [id number]  Principal Diagnosis: MDD, recurrent, severe  Secondary Diagnoses: Active Problems:   MDD (major depressive disorder), recurrent episode, severe ([facility])   Current Medications:  Current Facility-Administered Medications  Medication Dose Route Frequency Provider Last Rate Last Dose  . acetaminophen (TYLENOL) tablet 650 mg  650 mg Oral Q6H PRN Laverle Hobby, PA-C      . alum & mag hydroxide-simeth (MAALOX/MYLANTA) 200-200-20 MG/5ML suspension 30 mL  30 mL Oral Q4H PRN Patriciaann Clan E, PA-C      . ARIPiprazole (ABILIFY) tablet 5 mg  5 mg Oral Daily Cobos, Myer Peer, MD   5 mg at 03/28/18 0757  . citalopram (CELEXA) tablet 10 mg  10 mg Oral Daily Cobos, Myer Peer, MD   10 mg at 03/28/18 0757  . feeding supplement (ENSURE ENLIVE) (ENSURE ENLIVE) liquid 237 mL  237 mL Oral BID BM Sharma Covert, MD   237 mL at 03/27/18 1414  . hydrOXYzine (ATARAX/VISTARIL) tablet 25 mg  25 mg Oral Q6H PRN Patriciaann Clan E, PA-C      . magnesium hydroxide (MILK OF MAGNESIA) suspension 30 mL  30 mL Oral Daily PRN Patriciaann Clan E, PA-C      . nicotine (NICODERM CQ - dosed in mg/24 hours) patch 21 mg  21 mg Transdermal Daily Cobos, Myer Peer, MD   21 mg at 03/28/18 0759  . traZODone (DESYREL) tablet 50 mg  50 mg Oral QHS,MR X 1 Laverle Hobby, PA-C   50 mg at 03/27/18 2131   PTA Medications: No medications prior to admission.    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   Physician Treatment Plan for Primary Diagnosis: MDD, recurrent, severe Long Term Goal(s): Improvement in symptoms so as  ready for discharge Improvement in symptoms so as ready for discharge   Short Term Goals: Ability to identify triggers associated with substance abuse/mental health issues will improve Ability to identify changes in lifestyle to reduce recurrence of condition will improve Ability to verbalize feelings will improve Ability to disclose and discuss suicidal ideas Ability to demonstrate self-control will improve Ability to identify and develop effective coping behaviors will improve Ability to maintain clinical measurements within normal limits will improve  Medication Management: Evaluate patient's response, side effects, and tolerance of medication regimen.  Therapeutic Interventions: 1 to 1 sessions, Unit Group sessions and Medication administration.  Evaluation of Outcomes: Not Met  Physician Treatment Plan for Secondary Diagnosis: Active Problems:   MDD (major depressive disorder), recurrent episode, severe ([place])  Long Term Goal(s): Improvement in symptoms so as ready for discharge Improvement in symptoms so as ready for discharge   Short Term Goals: Ability to identify triggers associated with substance abuse/mental health issues will improve Ability to identify changes in lifestyle to reduce recurrence of condition will improve Ability to verbalize feelings will improve Ability to disclose and discuss suicidal ideas Ability to demonstrate self-control will improve Ability to identify and develop effective coping behaviors will improve Ability to maintain clinical measurements within normal limits will improve     Medication Management: Evaluate patient's response, side effects, and tolerance of medication regimen.  Therapeutic Interventions: 1 to 1 sessions, Unit Group sessions and Medication administration.  Evaluation of Outcomes:  Not Met   RN Treatment Plan for Primary Diagnosis: MDD, recurrent, severe Long Term Goal(s): Knowledge of disease and therapeutic regimen to  maintain health will improve  Short Term Goals: Ability to remain free from injury will improve, Ability to disclose and discuss suicidal ideas and Ability to identify and develop effective coping behaviors will improve  Medication Management: RN will administer medications as ordered by provider, will assess and evaluate patient's response and provide education to patient for prescribed medication. RN will report any adverse and/or side effects to prescribing provider.  Therapeutic Interventions: 1 on 1 counseling sessions, Psychoeducation, Medication administration, Evaluate responses to treatment, Monitor vital signs and CBGs as ordered, Perform/monitor CIWA, COWS, AIMS and Fall Risk screenings as ordered, Perform wound care treatments as ordered.  Evaluation of Outcomes: Not Met   LCSW Treatment Plan for Primary Diagnosis: MDD, recurrent, severe Long Term Goal(s): Safe transition to appropriate next level of care at discharge, Engage patient in therapeutic group addressing interpersonal concerns.  Short Term Goals: Engage patient in aftercare planning with referrals and resources, Facilitate patient progression through stages of change regarding substance use diagnoses and concerns and Identify triggers associated with mental health/substance abuse issues  Therapeutic Interventions: Assess for all discharge needs, 1 to 1 time with Social worker, Explore available resources and support systems, Assess for adequacy in community support network, Educate family and significant other(s) on suicide prevention, Complete Psychosocial Assessment, Interpersonal group therapy.  Evaluation of Outcomes: Not Met   Progress in Treatment: Attending groups: No. Participating in groups: No.  New to unit. Continuing to assess.  Taking medication as prescribed: Yes. Toleration medication: Yes. Family/Significant other contact made: No, will contact:  family member if pt consents to collateral contact.   Patient understands diagnosis: Yes. Discussing patient identified problems/goals with staff: Yes. Medical problems stabilized or resolved: Yes. Denies suicidal/homicidal ideation: Yes. Issues/concerns per patient self-inventory: No. Other: n/a  New problem(s) identified: No, Describe:  n/a  New Short Term/Long Term Goal(s): detox, medication management for mood stabilization; elimination of SI thoughts; development of comprehensive mental wellness/sobriety plan.   Patient Goals:  "to get long term placement somewhere for my drug use. I need help because I can't seem to stop."   Discharge Plan or Barriers: CSW assessing for appropriate referrals. detox, medication management for mood stabilization; elimination of SI thoughts; development of comprehensive mental wellness/sobriety plan.   Reason for Continuation of Hospitalization: Anxiety Depression Medication stabilization Suicidal ideation Withdrawal symptoms  Estimated Length of Stay: Thursday, 03/31/18  Attendees: Patient: Rick Bray 03/28/2018 9:11 AM  Physician: Dr. Nancy Fetter MD; Dr. Parke Poisson MD 03/28/2018 9:11 AM  Nursing: Opal Sidles RN; Clarise Cruz RN 03/28/2018 9:11 AM  RN Care Manager:x 03/28/2018 9:11 AM  Social Worker: Janice Norrie LCSW 03/28/2018 9:11 AM  Recreational Therapist: x 03/28/2018 9:11 AM  Other: Lindell Spar NP; Darnelle Maffucci Money NP 03/28/2018 9:11 AM  Other:  03/28/2018 9:11 AM  Other: 03/28/2018 9:11 AM    Scribe for Treatment Team: Avelina Laine, LCSW 03/28/2018 9:11 AM

## 2018-03-28 NOTE — Progress Notes (Signed)
D: Patient observed in bed tonight. Minimal engagement when approached by this Probation officer. Kept washcloth over his eyes and face. Minimal responses given. Acknowledges he is depressed however does not show interest in helping himself. States he attended a group today however review of chart indicates he did not. Patient states "nothing will help my situation. It will be the same when I leave here." Patient's affect blank, mood apathetic.   Denies pain, physical complaints.   A: Medicated per orders, no prns requested or required. Medication education provided. Level III obs in place for safety. Emotional support offered. Patient encouraged to complete Suicide Safety Plan before discharge. Strongly encouraged to attend and participate in unit programming, develop coping strategies.    R: Patient verbalizes understanding of POC however minimally receptive to this writer's suggestions.  Patient denies SI/HI/AVH and remains safe on level III obs. Will continue to monitor throughout the night.

## 2018-03-28 NOTE — Progress Notes (Signed)
D: Patient observed in dayroom eating snack earlier this evening. Patient states his day was good and reports "catching up on sleep. I just didn't feel like getting up either. I think it's the depression." Patient's affect flat, depressed with cognruent mood.  States he didn't have a goal for today "other than sleeping." Denies pain, physical complaints.   A: Medicated per orders, no prns required or requested. Level III obs in place for safety. Emotional support offered. Patient encouraged to attend evening group which he did.   R: Patient verbalizes understanding of POC. Patient has not needed 2300 dose of trazadone however knows to request it should he awake before 0200. Patient denies SI/HI/AVH and remains safe on level III obs. Will continue to monitor.

## 2018-03-28 NOTE — Plan of Care (Signed)
Patient verbalizes understanding of information, education provided. 

## 2018-03-29 DIAGNOSIS — F1210 Cannabis abuse, uncomplicated: Secondary | ICD-10-CM

## 2018-03-29 DIAGNOSIS — F1510 Other stimulant abuse, uncomplicated: Secondary | ICD-10-CM

## 2018-03-29 DIAGNOSIS — Z639 Problem related to primary support group, unspecified: Secondary | ICD-10-CM

## 2018-03-29 MED ORDER — DOXYCYCLINE HYCLATE 100 MG PO TABS
100.0000 mg | ORAL_TABLET | Freq: Two times a day (BID) | ORAL | Status: DC
  Administered 2018-03-29 – 2018-03-31 (×4): 100 mg via ORAL
  Filled 2018-03-29 (×6): qty 1
  Filled 2018-03-29: qty 8
  Filled 2018-03-29: qty 1
  Filled 2018-03-29 (×2): qty 8
  Filled 2018-03-29: qty 1
  Filled 2018-03-29: qty 8

## 2018-03-29 MED ORDER — NICOTINE POLACRILEX 2 MG MT GUM
2.0000 mg | CHEWING_GUM | OROMUCOSAL | Status: DC | PRN
  Administered 2018-03-30 – 2018-03-31 (×3): 2 mg via ORAL
  Filled 2018-03-29 (×4): qty 1

## 2018-03-29 NOTE — Progress Notes (Signed)
D: pt found in bed this morning. Pt compliant with medication administration. Pt denies any pain. Pt denies any si/hi/ah/vh and verbally agrees to approach staff if these become apparent. Pt stated he did not have a goal for today as of now.  A: pt given support and encouragement. Pt given medications per protocol and standing orders. q61m checks implemented and continued.  R: pt safe on the unit. Will continue to monitor.

## 2018-03-29 NOTE — [facility] Group Notes (Signed)
[facility] Group Therapy 03/29/2018 1:15pm  Type of Therapy: Mental Health [employer] Presentation  Participation Level: Invited. Chose to remain in bed.    Avelina Laine, LCSW 03/29/2018 1:26 PM

## 2018-03-29 NOTE — Progress Notes (Signed)
D    Pt endorses some depression and anxiety   He complained of his nicotine patch hurting his arm and his orders were changed to nicorette gum   His behavior is appropriate and he is quiet and cooperative  A   Verbal support given    Medications administered and effectiveness monitored    Q 15 min checks R   Pt is safe at present time and receptive to verbal support

## 2018-03-29 NOTE — [facility] Group Notes (Signed)
Pt attended spiritual care group on grief and loss facilitated by chaplain Jerene Pitch   Group opened with brief discussion and psycho-social ed around grief and loss both in relationships and in relation to self - identifying life patterns, circumstances, changes that cause losses. Established group norm of speaking from own life experience. Group engaged in facilitated dialog with goal of establishing open and affirming space for members to share loss and experience with grief, normalize grief experience and provide psycho social education and grief support. Members discussed Worden's four tasks of grief and related spaces where they felt resonance.    Kline was present at the beginning of group.  He connected with another group member around difficulty of caregiving and recognition of symptoms of psychosis and alcoholism in his grandmother.  Related some awareness of the impact of this on his grandparent's relationship.  Delonta left room halfway through group to speak with care provider.  Did not return to group.    [facility] / [facility] Chaplain Jerene Pitch, MDiv, [facility]

## 2018-03-29 NOTE — Plan of Care (Signed)
Pt progressing in the following metrics. Pt safe on the unit. Pt denies any si/hi/ah/vh and verbally agrees to approach staff if these become apparent. Pt denies any pain. Pt compliant with medication administration. Q56m safety checks implemented and continued. Will continue to monitor.  Problem: Education: Goal: Ability to state activities that reduce stress will improve Outcome: Progressing   Problem: Self-Concept: Goal: Ability to identify factors that promote anxiety will improve Outcome: Progressing Goal: Level of anxiety will decrease Outcome: Progressing   Problem: Education: Goal: Knowledge of the prescribed therapeutic regimen will improve Outcome: Progressing   Problem: Activity: Goal: Interest or engagement in leisure activities will improve Outcome: Progressing   Problem: Coping: Goal: Coping ability will improve Outcome: Progressing   Problem: Health Behavior/Discharge Planning: Goal: Ability to make decisions will improve Outcome: Progressing   Problem: Role Relationship: Goal: Will demonstrate positive changes in social behaviors and relationships Outcome: Progressing   Problem: Safety: Goal: Ability to disclose and discuss suicidal ideas will improve Outcome: Progressing   Problem: Self-Concept: Goal: Level of anxiety will decrease Outcome: Progressing   Problem: Education: Goal: Mental status will improve Outcome: Progressing   Problem: Activity: Goal: Interest or engagement in activities will improve Outcome: Progressing Goal: Sleeping patterns will improve Outcome: Progressing   Problem: Physical Regulation: Goal: Ability to maintain clinical measurements within normal limits will improve Outcome: Progressing   Problem: Safety: Goal: Periods of time without injury will increase Outcome: Progressing   Problem: Safety: Goal: Ability to remain free from injury will improve Outcome: Progressing

## 2018-03-29 NOTE — Progress Notes (Signed)
[facility] MD Progress Note  03/29/2018 5:03 PM Rick Bray  MRN:  [id number] Subjective: Patient is seen and examined.  Patient is a 32 year old male with a past psychiatric history significant for depression, suicidal thoughts.  He was admitted on 03/26/2018.  At that time he had been facing significant stressors including homelessness and a poor support network.  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  He had been using daily as well as cannabis abuse.  He was admitted to the hospital for evaluation and stabilization.  He was started on Abilify 5 mg p.o. daily as well as citalopram 10 mg p.o. daily.  He stated right now he just felt tired.  He was sleepy.  I told him that was probably from the methamphetamine withdrawal.  He denied any suicidal ideation today.  He has been staying in bed a great deal of time during the hospitalization.  He denied any suicidal, homicidal or psychotic behaviors. Principal Problem: MDD (major depressive disorder), recurrent episode, severe ([place]) Diagnosis:   Patient Active Problem List   Diagnosis Date Noted  . MDD (major depressive disorder), recurrent episode, severe ([place]) [F33.2] 03/26/2018   Total Time spent with patient: 20 minutes  Past Psychiatric History: See admission H&P  Past Medical History:  Past Medical History:  Diagnosis Date  . Leukemia in remission [facility])    History reviewed. No pertinent surgical history. Family History: History reviewed. No pertinent family history. Family Psychiatric  History: See admission H&P Social History:  Social History   Substance and Sexual Activity  Alcohol Use Yes     Social History   Substance and Sexual Activity  Drug Use Yes  . Types: Marijuana, Methamphetamines    Social History   Socioeconomic History  . Marital status: Single    Spouse name: Not on file  . Number of children: Not on file  . Years of education: Not on file  . Highest education level: Not on  file  Occupational History  . Not on file  Social Needs  . Financial resource strain: Not on file  . Food insecurity:    Worry: Not on file    Inability: Not on file  . Transportation needs:    Medical: Not on file    Non-medical: Not on file  Tobacco Use  . Smoking status: Current Every Day Smoker    Packs/day: 1.00    Years: 15.00    Pack years: 15.00    Types: Cigarettes  . Smokeless tobacco: Never Used  Substance and Sexual Activity  . Alcohol use: Yes  . Drug use: Yes    Types: Marijuana, Methamphetamines  . Sexual activity: Not on file  Lifestyle  . Physical activity:    Days per week: Not on file    Minutes per session: Not on file  . Stress: Not on file  Relationships  . Social connections:    Talks on phone: Not on file    Gets together: Not on file    Attends religious service: Not on file    Active member of club or organization: Not on file    Attends meetings of clubs or organizations: Not on file    Relationship status: Not on file  Other Topics Concern  . Not on file  Social History Narrative  . Not on file   Additional Social History:    Pain Medications: None Prescriptions: See MAR Over the Counter: None History of alcohol / drug use?: Yes Longest period  of sobriety (when/how long): unknown Negative Consequences of Use: Financial, Personal relationships Name of Substance 1: Meth.  1 - Age of First Use: Pt reported, 16.  1 - Amount (size/oz): Pt reported, snorting half a gram, yesterday.  1 - Duration: Ongoing.  1 - Last Use / Amount: Pt reported, yesterday. Name of Substance 2: Marijuana.  2 - Age of First Use: UTA 2 - Amount (size/oz): Pt reported, smoking a blunt, yesterday.  2 - Frequency: UTA 2 - Duration: UTA 2 - Last Use / Amount: Pt reported, yesterday.                Sleep: Good  Appetite:  Good  Current Medications: Current Facility-Administered Medications  Medication Dose Route Frequency Provider Last Rate Last Dose   . acetaminophen (TYLENOL) tablet 650 mg  650 mg Oral Q6H PRN Laverle Hobby, PA-C      . alum & mag hydroxide-simeth (MAALOX/MYLANTA) 200-200-20 MG/5ML suspension 30 mL  30 mL Oral Q4H PRN Patriciaann Clan E, PA-C      . ARIPiprazole (ABILIFY) tablet 5 mg  5 mg Oral Daily Cobos, Myer Peer, MD   5 mg at 03/29/18 0835  . citalopram (CELEXA) tablet 10 mg  10 mg Oral Daily Cobos, Myer Peer, MD   10 mg at 03/29/18 0835  . feeding supplement (ENSURE ENLIVE) (ENSURE ENLIVE) liquid 237 mL  237 mL Oral BID BM Sharma Covert, MD   237 mL at 03/29/18 1451  . hydrOXYzine (ATARAX/VISTARIL) tablet 25 mg  25 mg Oral Q6H PRN Patriciaann Clan E, PA-C   25 mg at 03/28/18 1324  . magnesium hydroxide (MILK OF MAGNESIA) suspension 30 mL  30 mL Oral Daily PRN Patriciaann Clan E, PA-C      . nicotine (NICODERM CQ - dosed in mg/24 hours) patch 21 mg  21 mg Transdermal Daily Cobos, Myer Peer, MD   21 mg at 03/29/18 1004  . traZODone (DESYREL) tablet 50 mg  50 mg Oral QHS,MR X 1 Laverle Hobby, PA-C   Stopped at 03/28/18 2300    Lab Results: No results found for this or any previous visit (from the past 48 hour(s)).  Blood Alcohol level:  Lab Results  Component Value Date   ETH <10 38/18/2993    Metabolic Disorder Labs: Lab Results  Component Value Date   HGBA1C 5.2 03/27/2018   MPG 102.54 03/27/2018   No results found for: PROLACTIN Lab Results  Component Value Date   CHOL 128 03/27/2018   TRIG 52 03/27/2018   HDL 49 03/27/2018   CHOLHDL 2.6 03/27/2018   VLDL 10 03/27/2018   LDLCALC 69 03/27/2018    Physical Findings: AIMS: Facial and Oral Movements Muscles of Facial Expression: None, normal Lips and Perioral Area: None, normal Jaw: None, normal Tongue: None, normal,Extremity Movements Upper (arms, wrists, hands, fingers): None, normal Lower (legs, knees, ankles, toes): None, normal, Trunk Movements Neck, shoulders, hips: None, normal, Overall Severity Severity of abnormal movements  (highest score from questions above): None, normal Incapacitation due to abnormal movements: None, normal Patient's awareness of abnormal movements (rate only patient's report): No Awareness, Dental Status Current problems with teeth and/or dentures?: No Does patient usually wear dentures?: No  CIWA:    COWS:     Musculoskeletal: Strength & Muscle Tone: within normal limits Gait & Station: normal Patient leans: N/A  Psychiatric Specialty Exam: Physical Exam  Constitutional: He is oriented to person, place, and time. He appears well-developed and well-nourished.  HENT:  Head: Normocephalic and atraumatic.  Respiratory: Effort normal.  Neurological: He is alert and oriented to person, place, and time.    ROS  Blood pressure 118/71, pulse 62, temperature 98 F (36.7 C), temperature source Oral, resp. rate 18, height 5' 6.14" (1.68 m), weight 55.8 kg (123 lb), SpO2 99 %.Body mass index is 19.77 kg/m.  General Appearance: Casual  Eye Contact:  Fair  Speech:  Normal Rate  Volume:  Decreased  Mood:  Dysphoric  Affect:  Appropriate  Thought Process:  Coherent  Orientation:  Full (Time, Place, and Person)  Thought Content:  Logical  Suicidal Thoughts:  No  Homicidal Thoughts:  No  Memory:  Immediate;   Fair Recent;   Fair Remote;   Fair  Judgement:  Intact  Insight:  Lacking  Psychomotor Activity:  Decreased  Concentration:  Concentration: Fair and Attention Span: Fair  Recall:  [employer] of Knowledge:  Fair  Language:  Good  Akathisia:  Negative  Handed:  Right  AIMS (if indicated):     Assets:  Physical Health  ADL's:  Intact  Cognition:  WNL  Sleep:  Number of Hours: 6.75     Treatment Plan Summary: Daily contact with patient to assess and evaluate symptoms and progress in treatment, Medication management and Plan Patient is seen and examined.  Patient is a 32 year old male with the above-stated past psychiatric history seen in follow-up.  He continues to slowly  improve.  He will be continued with his current medications.  Have encouraged him to attend groups and become more involved in his treatment.  Sharma Covert, MD 03/29/2018, 5:03 PM

## 2018-03-30 DIAGNOSIS — J869 Pyothorax without fistula: Secondary | ICD-10-CM

## 2018-03-30 MED ORDER — ARIPIPRAZOLE 5 MG PO TABS
5.0000 mg | ORAL_TABLET | Freq: Every day | ORAL | Status: DC
  Filled 2018-03-30: qty 21
  Filled 2018-03-30: qty 1
  Filled 2018-03-30: qty 21

## 2018-03-30 NOTE — Progress Notes (Signed)
Patient expressed in group that he had a good day overall but did not go into further detail. His goal for tomorrow is to find out more details about his discharge.

## 2018-03-30 NOTE — Plan of Care (Signed)
Pt progressing/not progressing in the following metrics. Pt has yet to attend groups. Pt denies any si/hi/ah/vh and verbally agrees to approach staff if these become apparent. Pt denies any physical pain or withdrawal symptoms. Q39m safety checks implemented and continued. Pt safe on the unit. Will continue to monitor.  Problem: Self-Concept: Goal: Level of anxiety will decrease Outcome: Progressing   Problem: Activity: Goal: Imbalance in normal sleep/wake cycle will improve Outcome: Progressing   Problem: Safety: Goal: Ability to disclose and discuss suicidal ideas will improve Outcome: Progressing   Problem: Self-Concept: Goal: Level of anxiety will decrease Outcome: Progressing   Problem: Education: Goal: Emotional status will improve Outcome: Progressing Goal: Mental status will improve Outcome: Progressing   Problem: Activity: Goal: Sleeping patterns will improve Outcome: Progressing   Problem: Coping: Goal: Ability to demonstrate self-control will improve Outcome: Progressing   Problem: Safety: Goal: Periods of time without injury will increase Outcome: Progressing   Problem: Health Behavior/Discharge Planning: Goal: Ability to remain free from injury will improve Outcome: Progressing   Problem: Skin Integrity: Goal: Demonstration of wound healing without infection will improve Outcome: Progressing   Problem: Safety: Goal: Ability to remain free from injury will improve Outcome: Progressing

## 2018-03-30 NOTE — Progress Notes (Signed)
Per Carles Collet at [facility] the patient has been accepted for residential treatment.   Per Myrlene Broker, the patient will need a 21-day supply of medications prior to admission.   The patient will be picked up at 12:30pm, Thursday, 03/31/18.     Radonna Ricker, MSW, [place] Worker [facility]  Phone: [phone]

## 2018-03-30 NOTE — Progress Notes (Signed)
Recreation Therapy Notes  Date: 6.5.19 Time: 0930 Location: [address] Dayroom  Group Topic: Stress Management  Goal Area(s) Addresses:  Patient will verbalize importance of using healthy stress management.  Patient will identify positive emotions associated with healthy stress management.   Intervention: Stress Management  Activity :  Guided Imagery.  LRT introduced patients to the stress management technique of guided imagery.  LRT read a script that lead patients on mental vacation to their peaceful place.  Patients were to follow along as the script was read to engage in the activity.  Education:  Stress Management, Discharge Planning.   Education Outcome: Acknowledges edcuation/In group clarification offered/Needs additional education  Clinical Observations/Feedback:  Pt did not attend group.     Victorino Sparrow, LRT/CTRS         Ria Comment, Ziere Docken A 03/30/2018 10:56 AM

## 2018-03-30 NOTE — Progress Notes (Signed)
[facility] MD Progress Note  03/30/2018 11:36 AM Rick Bray  MRN:  [id number] Subjective: Patient is seen and examined.  Patient is a 32 year old male with a past psychiatric history significant for depression and suicidal thoughts.  He also has a history of methamphetamine use disorder.  He also was using cannabis daily.  He was sleeping in bed late today.  We discussed his disposition with regard to his substance issues.  He does not have a plan in place.  He initially stated he was thinking about going to [place], but then stated he had no money to be able to go there.  We discussed the fact that he needs to become more active in his treatment, and start making phone calls with regard to where he is going to get his outpatient care.  He denied any side effects to his current medications.  He denied any suicidality. Principal Problem: MDD (major depressive disorder), recurrent episode, severe ([place]) Diagnosis:   Patient Active Problem List   Diagnosis Date Noted  . MDD (major depressive disorder), recurrent episode, severe ([place]) [F33.2] 03/26/2018   Total Time spent with patient: 20 minutes  Past Psychiatric History: See admission H&P  Past Medical History:  Past Medical History:  Diagnosis Date  . Leukemia in remission [facility])    History reviewed. No pertinent surgical history. Family History: History reviewed. No pertinent family history. Family Psychiatric  History: See admission H&P Social History:  Social History   Substance and Sexual Activity  Alcohol Use Yes     Social History   Substance and Sexual Activity  Drug Use Yes  . Types: Marijuana, Methamphetamines    Social History   Socioeconomic History  . Marital status: Single    Spouse name: Not on file  . Number of children: Not on file  . Years of education: Not on file  . Highest education level: Not on file  Occupational History  . Not on file  Social Needs  . Financial resource strain: Not on file  . Food  insecurity:    Worry: Not on file    Inability: Not on file  . Transportation needs:    Medical: Not on file    Non-medical: Not on file  Tobacco Use  . Smoking status: Current Every Day Smoker    Packs/day: 1.00    Years: 15.00    Pack years: 15.00    Types: Cigarettes  . Smokeless tobacco: Never Used  Substance and Sexual Activity  . Alcohol use: Yes  . Drug use: Yes    Types: Marijuana, Methamphetamines  . Sexual activity: Not on file  Lifestyle  . Physical activity:    Days per week: Not on file    Minutes per session: Not on file  . Stress: Not on file  Relationships  . Social connections:    Talks on phone: Not on file    Gets together: Not on file    Attends religious service: Not on file    Active member of club or organization: Not on file    Attends meetings of clubs or organizations: Not on file    Relationship status: Not on file  Other Topics Concern  . Not on file  Social History Narrative  . Not on file   Additional Social History:    Pain Medications: None Prescriptions: See MAR Over the Counter: None History of alcohol / drug use?: Yes Longest period of sobriety (when/how long): unknown Negative Consequences of Use: Financial, Personal relationships  Name of Substance 1: Meth.  1 - Age of First Use: Pt reported, 16.  1 - Amount (size/oz): Pt reported, snorting half a gram, yesterday.  1 - Duration: Ongoing.  1 - Last Use / Amount: Pt reported, yesterday. Name of Substance 2: Marijuana.  2 - Age of First Use: UTA 2 - Amount (size/oz): Pt reported, smoking a blunt, yesterday.  2 - Frequency: UTA 2 - Duration: UTA 2 - Last Use / Amount: Pt reported, yesterday.                Sleep: Good  Appetite:  Good  Current Medications: Current Facility-Administered Medications  Medication Dose Route Frequency Provider Last Rate Last Dose  . acetaminophen (TYLENOL) tablet 650 mg  650 mg Oral Q6H PRN Laverle Hobby, PA-C      . alum & mag  hydroxide-simeth (MAALOX/MYLANTA) 200-200-20 MG/5ML suspension 30 mL  30 mL Oral Q4H PRN Laverle Hobby, PA-C      . [START ON 03/31/2018] ARIPiprazole (ABILIFY) tablet 5 mg  5 mg Oral QHS Sharma Covert, MD      . citalopram (CELEXA) tablet 10 mg  10 mg Oral Daily Cobos, Myer Peer, MD   10 mg at 03/30/18 0811  . doxycycline (VIBRA-TABS) tablet 100 mg  100 mg Oral Q12H Lindon Romp A, NP   100 mg at 03/30/18 0811  . feeding supplement (ENSURE ENLIVE) (ENSURE ENLIVE) liquid 237 mL  237 mL Oral BID BM Sharma Covert, MD   237 mL at 03/30/18 1034  . hydrOXYzine (ATARAX/VISTARIL) tablet 25 mg  25 mg Oral Q6H PRN Laverle Hobby, PA-C   25 mg at 03/29/18 2039  . magnesium hydroxide (MILK OF MAGNESIA) suspension 30 mL  30 mL Oral Daily PRN Laverle Hobby, PA-C      . nicotine polacrilex (NICORETTE) gum 2 mg  2 mg Oral PRN Lindon Romp A, NP      . traZODone (DESYREL) tablet 50 mg  50 mg Oral QHS,MR X 1 Laverle Hobby, PA-C   50 mg at 03/29/18 2141    Lab Results: No results found for this or any previous visit (from the past 48 hour(s)).  Blood Alcohol level:  Lab Results  Component Value Date   ETH <10 28/41/3244    Metabolic Disorder Labs: Lab Results  Component Value Date   HGBA1C 5.2 03/27/2018   MPG 102.54 03/27/2018   No results found for: PROLACTIN Lab Results  Component Value Date   CHOL 128 03/27/2018   TRIG 52 03/27/2018   HDL 49 03/27/2018   CHOLHDL 2.6 03/27/2018   VLDL 10 03/27/2018   LDLCALC 69 03/27/2018    Physical Findings: AIMS: Facial and Oral Movements Muscles of Facial Expression: None, normal Lips and Perioral Area: None, normal Jaw: None, normal Tongue: None, normal,Extremity Movements Upper (arms, wrists, hands, fingers): None, normal Lower (legs, knees, ankles, toes): None, normal, Trunk Movements Neck, shoulders, hips: None, normal, Overall Severity Severity of abnormal movements (highest score from questions above): None,  normal Incapacitation due to abnormal movements: None, normal Patient's awareness of abnormal movements (rate only patient's report): No Awareness, Dental Status Current problems with teeth and/or dentures?: No Does patient usually wear dentures?: No  CIWA:    COWS:     Musculoskeletal: Strength & Muscle Tone: within normal limits Gait & Station: normal Patient leans: N/A  Psychiatric Specialty Exam: Physical Exam  Nursing note and vitals reviewed. Constitutional: He is oriented to person, place,  and time. He appears well-developed and well-nourished.  HENT:  Head: Normocephalic and atraumatic.  Respiratory: Effort normal.  Musculoskeletal: Normal range of motion.  Neurological: He is alert and oriented to person, place, and time.    ROS  Blood pressure (!) 126/92, pulse 98, temperature 98 F (36.7 C), temperature source Oral, resp. rate 18, height 5' 6.14" (1.68 m), weight 55.8 kg (123 lb), SpO2 99 %.Body mass index is 19.77 kg/m.  General Appearance: Disheveled  Eye Contact:  Fair  Speech:  Normal Rate  Volume:  Normal  Mood:  Euthymic  Affect:  Congruent  Thought Process:  Coherent  Orientation:  Full (Time, Place, and Person)  Thought Content:  Logical  Suicidal Thoughts:  No  Homicidal Thoughts:  No  Memory:  Immediate;   Fair Recent;   Fair Remote;   Fair  Judgement:  Impaired  Insight:  Lacking  Psychomotor Activity:  Decreased  Concentration:  Concentration: Fair and Attention Span: Fair  Recall:  [employer] of Knowledge:  Fair  Language:  Fair  Akathisia:  Negative  Handed:  Right  AIMS (if indicated):     Assets:  Desire for Improvement Physical Health  ADL's:  Intact  Cognition:  WNL  Sleep:  Number of Hours: 6.75     Treatment Plan Summary: Daily contact with patient to assess and evaluate symptoms and progress in treatment, Medication management and Plan Patient is seen and examined.  Patient is a 32 year old male with the above-stated past  psychiatric history seen in follow-up.  He continues on Abilify as well as citalopram.  He is also getting doxycycline for an abscess on the right side of his lateral chest.  He is not been very active and participating in his own care.  He really has made no effort to contact any outpatient rehabilitation programs.  I will discuss with social work today discharge planning.  I would anticipate with his stability right now that he could be discharged in 1 to 2 days.  Hopefully social work will be able to get some substance abuse treatment program plan in place.  Otherwise no change in his current medications.  Sharma Covert, MD 03/30/2018, 11:36 AM

## 2018-03-30 NOTE — Progress Notes (Signed)
Pt did not attend goals and orientation group this morning  

## 2018-03-30 NOTE — Progress Notes (Signed)
D: pt found in bed this morning. Pt compliant with medication pass. Pt denies any si/hi/ah/vh and verbally agrees to approach staff if these become apparent. Pt denies any physical pain or withdrawal symptoms.  A: pt given support and encouragement. Pt provided medications per protocol and standing orders. q36m safety checks implemented and continued.  R: pt safe on the unit. Pt resting at this time. Will continue to monitor.

## 2018-03-30 NOTE — Progress Notes (Signed)
Nursing Progress Note: 7p-7a D: Pt currently presents with a apathetic/sad/brightens on approach affect and behavior. Pt states "I feel like I have a lot of liquid in my right ear. It is causing a lot of pressure and it hurts." Interacting appropriately with the milieu. Pt reports good sleep during the previous night with current medication regimen. Pt did not attend wrap-up group.  A: Pt provided with medications per providers orders. Pt's labs and vitals were monitored throughout the night. Pt supported emotionally and encouraged to express concerns and questions. Pt educated on medications.  R: Pt's safety ensured with 15 minute and environmental checks. Pt currently denies SI, HI, and AVH. Pt verbally contracts to seek staff if SI,HI, or AVH occurs and to consult with staff before acting on any harmful thoughts. Will continue to monitor.

## 2018-03-30 NOTE — BHH Group Notes (Signed)
LCSW Group Therapy Note 03/30/2018 3:29 PM  Type of Therapy and Topic: Group Therapy: Feelings around Relapse and Recovery  Participation Level: Did Not Attend   Description of Group:  Patients in this group will discuss emotions they experience before and after a relapse. They will process how experiencing these feelings, or avoidance of experiencing them, relates to having a relapse. Facilitator will guide patients to explore emotions they have related to recovery. Patients will be encouraged to process which emotions are more powerful. They will be guided to discuss the emotional reaction significant others in their lives may have to their relapse or recovery. Patients will be assisted in exploring ways to respond to the emotions of others without this contributing to a relapse.  Therapeutic Goals: 1. Patient will identify two or more emotions that lead to a relapse for them 2. Patient will identify two emotions that result when they relapse 3. Patient will identify two emotions related to recovery 4. Patient will demonstrate ability to communicate their needs through discussion and/or role plays  Summary of Patient Progress:  Invited, chose not to attend.    Therapeutic Modalities:  Cognitive Behavioral Therapy Solution-Focused Therapy Assertiveness Training Relapse Prevention Therapy   Theresa Duty Clinical Social Worker

## 2018-03-31 DIAGNOSIS — F4001 Agoraphobia with panic disorder: Secondary | ICD-10-CM

## 2018-03-31 DIAGNOSIS — Z915 Personal history of self-harm: Secondary | ICD-10-CM

## 2018-03-31 DIAGNOSIS — Z8659 Personal history of other mental and behavioral disorders: Secondary | ICD-10-CM

## 2018-03-31 MED ORDER — TRAZODONE HCL 50 MG PO TABS
50.0000 mg | ORAL_TABLET | Freq: Every evening | ORAL | Status: DC | PRN
  Filled 2018-03-31: qty 21

## 2018-03-31 MED ORDER — ARIPIPRAZOLE 5 MG PO TABS: 5.0000 mg | ORAL_TABLET | Freq: Every day | ORAL | 0 refills | Status: DC

## 2018-03-31 MED ORDER — DOXYCYCLINE HYCLATE 100 MG PO TABS: 100.0000 mg | ORAL_TABLET | Freq: Two times a day (BID) | ORAL | 0 refills | Status: DC

## 2018-03-31 MED ORDER — CITALOPRAM HYDROBROMIDE 10 MG PO TABS: 10.0000 mg | ORAL_TABLET | Freq: Every day | ORAL | 0 refills | Status: DC

## 2018-03-31 MED ORDER — HYDROXYZINE HCL 25 MG PO TABS: 25.0000 mg | ORAL_TABLET | Freq: Four times a day (QID) | ORAL | 0 refills | Status: DC | PRN

## 2018-03-31 MED ORDER — TRAZODONE HCL 50 MG PO TABS: 50.0000 mg | ORAL_TABLET | Freq: Every evening | ORAL | 0 refills | Status: DC | PRN

## 2018-03-31 NOTE — Progress Notes (Signed)
Patient ID: Rick Bray, male   DOB: June 05, 1986, 32 y.o.   MRN: [id number] Patient discharged to [facility] to continue rehab therapy.  Patient denies SI, HI, and AVH upon discharge.  Patient was in a pleasant mood.  Patient acknowledged understanding of all discharge instructions and receipt of all belongings.

## 2018-03-31 NOTE — Progress Notes (Signed)
  [facility] Adult Case Management Discharge Plan :  Will you be returning to the same living situation after discharge:  No. Patient is discharging to [facility] for residential treatment at discharge At discharge, do you have transportation home?: Yes,  patient will be picked up at 12:00pm by a representative from [place] you have the ability to pay for your medications: No.   Release of information consent forms completed and in the chart;  Patient's signature needed at discharge.  Patient to Follow up at: Follow-up Information    [facility]. Call.   Specialty:  Behavioral Health Why:  Follow up following your discharge from [facility] for medication management and therapy services. Please be sure to bring your Photo ID, social security number, and any discharge paperwork from the hospital.  Contact information: [place] [address] [phone]        [facility] Follow up.   Specialty:  Addiction Medicine Why:  Patient going to [place] on 03/31/18 at 12:00pm.   Contact information: [place] [address] [phone]           Next level of care provider has access to [place] and Suicide Prevention discussed: Yes,  with the patient  Have you used any form of tobacco in the last 30 days? (Cigarettes, Smokeless Tobacco, Cigars, and/or Pipes): Yes  Has patient been referred to the Quitline?: Patient refused referral  Patient has been referred for addiction treatment: Yes  Rick Bray, [place] 03/31/2018, 10:16 AM

## 2018-03-31 NOTE — BHH Suicide Risk Assessment (Signed)
[facility] Discharge Suicide Risk Assessment   Principal Problem: MDD (major depressive disorder), recurrent episode, severe (Rick Bray) Discharge Diagnoses:  Patient Active Problem List   Diagnosis Date Noted  . MDD (major depressive disorder), recurrent episode, severe ([place]) [F33.2] 03/26/2018    Total Time spent with patient: 30 minutes  Musculoskeletal: Strength & Muscle Tone: within normal limits Gait & Station: normal Patient leans: N/A  Psychiatric Specialty Exam: Review of Systems  All other systems reviewed and are negative.   Blood pressure (!) 126/92, pulse 98, temperature 98 F (36.7 C), temperature source Oral, resp. rate 18, height 5' 6.14" (1.68 m), weight 55.8 kg (123 lb), SpO2 99 %.Body mass index is 19.77 kg/m.  General Appearance: Casual  Eye Contact::  Fair  Speech:  Normal Rate409  Volume:  Normal  Mood:  Euthymic  Affect:  Appropriate  Thought Process:  Coherent  Orientation:  Full (Time, Place, and Person)  Thought Content:  Logical  Suicidal Thoughts:  No  Homicidal Thoughts:  No  Memory:  Immediate;   Fair Recent;   Fair Remote;   Fair  Judgement:  Intact  Insight:  Fair  Psychomotor Activity:  Normal  Concentration:  Fair  Recall:  [employer] of Knowledge:Fair  Language: Fair  Akathisia:  Negative  Handed:  Right  AIMS (if indicated):     Assets:  Desire for Improvement Physical Health Resilience  Sleep:  Number of Hours: 6.75  Cognition: WNL  ADL's:  Intact   Mental Status Per Nursing Assessment::   On Admission:  Suicidal ideation indicated by patient, Self-harm thoughts  Demographic Factors:  Male, Caucasian, Low socioeconomic status and Unemployed  Loss Factors: NA  Historical Factors: Impulsivity  Risk Reduction Factors:   Positive coping skills or problem solving skills  Continued Clinical Symptoms:  Alcohol/Substance Abuse/Dependencies  Cognitive Features That Contribute To Risk:  None    Suicide Risk:  Minimal: No  identifiable suicidal ideation.  Patients presenting with no risk factors but with morbid ruminations; may be classified as minimal risk based on the severity of the depressive symptoms  Follow-up Information    [facility] Follow up.   Why:  You have been placed on waitlist. Please call June with a good number by discharge so that she can call you directly when bed becomes available. Thank you.  Contact information: [address] [place] [address] [phone]        [facility]. Go on 04/05/2018.   Specialty:  Behavioral Health Why:  Appointment is Tuesday, 04/05/18 at 8:00am. Please be sure to bring your Photo ID, social security number, and any discharge paperwork from the hospital.  Contact information: [place] [address] [phone]        [facility] Follow up.   Specialty:  Addiction Medicine Why:  Referral made, 03/29/18.  Contact information: [place] [address] [phone]           Plan Of Care/Follow-up recommendations:  Activity:  ad lib  Sharma Covert, MD 03/31/2018, 7:36 AM

## 2018-03-31 NOTE — BHH Suicide Risk Assessment (Signed)
[facility] INPATIENT:  Family/Significant Other Suicide Prevention Education  Suicide Prevention Education:  Patient Refusal for Family/Significant Other Suicide Prevention Education: The patient Rick Bray has refused to provide written consent for family/significant other to be provided Family/Significant Other Suicide Prevention Education during admission and/or prior to discharge.  Physician notified.  SPE completed with patient, as patient refused to consent to family contact. SPI pamphlet provided to pt and pt was encouraged to share information with support network, ask questions, and talk about any concerns relating to SPE. Patient denies access to guns/firearms and verbalized understanding of information provided. Mobile Crisis information also provided to patient.    Marylee Floras  03/31/2018, 10:18 AM

## 2018-03-31 NOTE — Discharge Summary (Signed)
Physician Discharge Summary Note  Patient:  Rick Bray is an 32 y.o., male MRN:  [id number] DOB:  January 22, 1986 Patient phone:  [phone] (home)  Patient address:   [place] [address],  Total Time spent with patient: 20 minutes  Date of Admission:  03/26/2018 Date of Discharge: 03/31/18   Reason for Admission:  Worsening depression with SI  Principal Problem: MDD (major depressive disorder), recurrent episode, severe [facility]) Discharge Diagnoses: Patient Active Problem List   Diagnosis Date Noted  . MDD (major depressive disorder), recurrent episode, severe ([place]) [F33.2] 03/26/2018    Past Psychiatric History: He reports a history of depression, one prior psychiatric admission approximately 3 years ago for "the same".  Reports prior suicide attempts by overdosing in 2014 and by cutting himself in 2016.  He reports history of cutting and has old scars on forearms, denies any recent self-injurious behaviors.  Endorses excessive worrying/anxiety sometimes leading to panic symptoms.  Also acknowledges agoraphobia.  States that he has been told he has "schizoaffective" in the past but is not currently endorsing any history of psychosis   Past Medical History:  Past Medical History:  Diagnosis Date  . Leukemia in remission [facility])    History reviewed. No pertinent surgical history. Family History: History reviewed. No pertinent family history. Family Psychiatric  History: Reports father was diagnosed with schizophrenia  Social History:  Social History   Substance and Sexual Activity  Alcohol Use Yes     Social History   Substance and Sexual Activity  Drug Use Yes  . Types: Marijuana, Methamphetamines    Social History   Socioeconomic History  . Marital status: Single    Spouse name: Not on file  . Number of children: Not on file  . Years of education: Not on file  . Highest education level: Not on file  Occupational History  . Not on file  Social Needs  .  Financial resource strain: Not on file  . Food insecurity:    Worry: Not on file    Inability: Not on file  . Transportation needs:    Medical: Not on file    Non-medical: Not on file  Tobacco Use  . Smoking status: Current Every Day Smoker    Packs/day: 1.00    Years: 15.00    Pack years: 15.00    Types: Cigarettes  . Smokeless tobacco: Never Used  Substance and Sexual Activity  . Alcohol use: Yes  . Drug use: Yes    Types: Marijuana, Methamphetamines  . Sexual activity: Not on file  Lifestyle  . Physical activity:    Days per week: Not on file    Minutes per session: Not on file  . Stress: Not on file  Relationships  . Social connections:    Talks on phone: Not on file    Gets together: Not on file    Attends religious service: Not on file    Active member of club or organization: Not on file    Attends meetings of clubs or organizations: Not on file    Relationship status: Not on file  Other Topics Concern  . Not on file  Social History Narrative  . Not on file    Hospital Course:   03/26/18 [facility] MD Assessment: 32 year old male, currently homeless, reports worsening depression and suicidal thoughts, with thoughts of cutting self.  Endorses significant neurovegetative symptoms of depression as below.  Reports he has been facing significant stressors to include homelessness and poor support network. He reports  substance abuse, identifies methamphetamine as substance of choice, has been using almost daily /he also endorses cannabis abuse  Patient remained on the [facility] unit for 5 days. The patient stabilized on medication and therapy. Patient was discharged on Abilify 5 mg Daily, Celexa 10 mg Daily, and Trazodone 50 mg QHS PRN. Patient has shown improvement with improved mood, affect, sleep, appetite, and interaction. Patient has attended group and participated. Patient has been seen in the day room interacting with peers and staff appropriately. Patient denies any SI/HI/AVH and  contracts for safety. Patient agrees to follow up at [facility] and then [facility] and [facility]. Patient is provided with prescriptions for their medications upon discharge.   Physical Findings: AIMS: Facial and Oral Movements Muscles of Facial Expression: None, normal Lips and Perioral Area: None, normal Jaw: None, normal Tongue: None, normal,Extremity Movements Upper (arms, wrists, hands, fingers): None, normal Lower (legs, knees, ankles, toes): None, normal, Trunk Movements Neck, shoulders, hips: None, normal, Overall Severity Severity of abnormal movements (highest score from questions above): None, normal Incapacitation due to abnormal movements: None, normal Patient's awareness of abnormal movements (rate only patient's report): No Awareness, Dental Status Current problems with teeth and/or dentures?: No Does patient usually wear dentures?: No  CIWA:    COWS:     Musculoskeletal: Strength & Muscle Tone: within normal limits Gait & Station: normal Patient leans: N/A  Psychiatric Specialty Exam: Physical Exam  Nursing note and vitals reviewed. Constitutional: He is oriented to person, place, and time. He appears well-developed and well-nourished.  Cardiovascular: Normal rate.  Respiratory: Effort normal.  Musculoskeletal: Normal range of motion.  Neurological: He is alert and oriented to person, place, and time.  Skin: Skin is warm.    Review of Systems  Constitutional: Negative.   HENT: Negative.   Eyes: Negative.   Respiratory: Negative.   Cardiovascular: Negative.   Gastrointestinal: Negative.   Genitourinary: Negative.   Musculoskeletal: Negative.   Skin: Negative.   Neurological: Negative.   Endo/Heme/Allergies: Negative.   Psychiatric/Behavioral: Negative.     Blood pressure 122/62, pulse 69, temperature 97.7 F (36.5 C), temperature source Oral, resp. rate 16, height 5' 6.14" (1.68 m), weight 55.8 kg (123 lb), SpO2 99 %.Body mass index is 19.77  kg/m.  General Appearance: Casual  Eye Contact:  Good  Speech:  Clear and Coherent and Normal Rate  Volume:  Normal  Mood:  Euthymic  Affect:  Congruent  Thought Process:  Goal Directed and Descriptions of Associations: Intact  Orientation:  Full (Time, Place, and Person)  Thought Content:  WDL  Suicidal Thoughts:  No  Homicidal Thoughts:  No  Memory:  Immediate;   Good Recent;   Good Remote;   Good  Judgement:  Fair  Insight:  Fair  Psychomotor Activity:  Normal  Concentration:  Concentration: Good and Attention Span: Good  Recall:  Good  Fund of Knowledge:  Good  Language:  Good  Akathisia:  No  Handed:  Right  AIMS (if indicated):     Assets:  Communication Skills Desire for Improvement Financial Resources/Insurance Housing Physical Health Social Support Transportation  ADL's:  Intact  Cognition:  WNL  Sleep:  Number of Hours: 6.75     Have you used any form of tobacco in the last 30 days? (Cigarettes, Smokeless Tobacco, Cigars, and/or Pipes): Yes  Has this patient used any form of tobacco in the last 30 days? (Cigarettes, Smokeless Tobacco, Cigars, and/or Pipes) Yes, Yes, A prescription for an FDA-approved tobacco cessation  medication was offered at discharge and the patient refused  Blood Alcohol level:  Lab Results  Component Value Date   [facility] <10 15/17/6160    Metabolic Disorder Labs:  Lab Results  Component Value Date   HGBA1C 5.2 03/27/2018   MPG 102.54 03/27/2018   No results found for: PROLACTIN Lab Results  Component Value Date   CHOL 128 03/27/2018   TRIG 52 03/27/2018   HDL 49 03/27/2018   CHOLHDL 2.6 03/27/2018   VLDL 10 03/27/2018   LDLCALC 69 03/27/2018    See Psychiatric Specialty Exam and Suicide Risk Assessment completed by Attending Physician prior to discharge.  Discharge destination:  [facility]  Is patient on multiple antipsychotic therapies at discharge:  No   Has Patient had three or more failed trials of antipsychotic  monotherapy by history:  No  Recommended Plan for Multiple Antipsychotic Therapies: NA   Allergies as of 03/31/2018      Reactions   Risperidone And Related    Faint      Medication List    TAKE these medications     Indication  ARIPiprazole 5 MG tablet Commonly known as:  ABILIFY Take 1 tablet (5 mg total) by mouth at bedtime. For mood control  Indication:  mood stability   citalopram 10 MG tablet Commonly known as:  CELEXA Take 1 tablet (10 mg total) by mouth daily. For mood control  Indication:  mood stability   doxycycline 100 MG tablet Commonly known as:  VIBRA-TABS Take 1 tablet (100 mg total) by mouth every 12 (twelve) hours.  Indication:  Infection of the Skin and/or Soft Tissue   hydrOXYzine 25 MG tablet Commonly known as:  ATARAX/VISTARIL Take 1 tablet (25 mg total) by mouth every 6 (six) hours as needed for anxiety.  Indication:  Feeling Anxious   traZODone 50 MG tablet Commonly known as:  DESYREL Take 1 tablet (50 mg total) by mouth at bedtime as needed for sleep.  Indication:  Trouble Sleeping      Follow-up Information    [facility] Follow up.   Why:  You have been placed on waitlist. Please call June with a good number by discharge so that she can call you directly when bed becomes available. Thank you.  Contact information: [address] [place] [address] [phone]        [facility]. Go on 04/05/2018.   Specialty:  Behavioral Health Why:  Appointment is Tuesday, 04/05/18 at 8:00am. Please be sure to bring your Photo ID, social security number, and any discharge paperwork from the hospital.  Contact information: [place] [address] [phone]        [facility] Follow up.   Specialty:  Addiction Medicine Why:  Referral made, 03/29/18.  Contact information: [place] [address] [phone]           Follow-up recommendations:  Continue  activity as tolerated. Continue diet as recommended by your PCP. Ensure to keep all appointments with outpatient providers.  Comments:  Patient is instructed prior to discharge to: Take all medications as prescribed by his/her mental healthcare provider. Report any adverse effects and or reactions from the medicines to his/her outpatient provider promptly. Patient has been instructed & cautioned: To not engage in alcohol and or illegal drug use while on prescription medicines. In the event of worsening symptoms, patient is instructed to call the crisis hotline, 911 and or go to the nearest ED  for appropriate evaluation and treatment of symptoms. To follow-up with his/her primary care provider for your other medical issues, concerns and or health care needs.    Signed: [place], FNP 03/31/2018, 8:14 AM

## 2018-04-05 ENCOUNTER — Emergency Department (HOSPITAL_COMMUNITY)
Admission: EM | Admit: 2018-04-05 | Discharge: 2018-04-06 | Disposition: A | Discharge status: Home / Self Care | Payer: Self-pay | Attending: Emergency Medicine | Admitting: Emergency Medicine

## 2018-04-05 ENCOUNTER — Encounter (HOSPITAL_COMMUNITY): Payer: Self-pay | Admitting: *Deleted

## 2018-04-05 ENCOUNTER — Other Ambulatory Visit: Payer: Self-pay

## 2018-04-05 DIAGNOSIS — F329 Major depressive disorder, single episode, unspecified: Secondary | ICD-10-CM

## 2018-04-05 DIAGNOSIS — Z856 Personal history of leukemia: Secondary | ICD-10-CM | POA: Insufficient documentation

## 2018-04-05 DIAGNOSIS — F259 Schizoaffective disorder, unspecified: Secondary | ICD-10-CM | POA: Insufficient documentation

## 2018-04-05 DIAGNOSIS — F1120 Opioid dependence, uncomplicated: Secondary | ICD-10-CM | POA: Insufficient documentation

## 2018-04-05 DIAGNOSIS — Z79899 Other long term (current) drug therapy: Secondary | ICD-10-CM | POA: Insufficient documentation

## 2018-04-05 DIAGNOSIS — F17210 Nicotine dependence, cigarettes, uncomplicated: Secondary | ICD-10-CM | POA: Insufficient documentation

## 2018-04-05 DIAGNOSIS — R45851 Suicidal ideations: Secondary | ICD-10-CM | POA: Insufficient documentation

## 2018-04-05 DIAGNOSIS — F1414 Cocaine abuse with cocaine-induced mood disorder: Secondary | ICD-10-CM

## 2018-04-05 DIAGNOSIS — F1410 Cocaine abuse, uncomplicated: Secondary | ICD-10-CM | POA: Insufficient documentation

## 2018-04-05 DIAGNOSIS — F32A Depression, unspecified: Secondary | ICD-10-CM

## 2018-04-05 DIAGNOSIS — F332 Major depressive disorder, recurrent severe without psychotic features: Secondary | ICD-10-CM | POA: Insufficient documentation

## 2018-04-05 HISTORY — DX: Schizoaffective disorder, unspecified: F25.9

## 2018-04-05 HISTORY — DX: Major depressive disorder, single episode, unspecified: F32.9

## 2018-04-05 LAB — COMPREHENSIVE METABOLIC PANEL WITH GFR
ALT: 24 U/L (ref 17–63)
AST: 17 U/L (ref 15–41)
Albumin: 4.3 g/dL (ref 3.5–5.0)
Alkaline Phosphatase: 45 U/L (ref 38–126)
Anion gap: 9 (ref 5–15)
BUN: 22 mg/dL — ABNORMAL HIGH (ref 6–20)
CO2: 29 mmol/L (ref 22–32)
Calcium: 9.1 mg/dL (ref 8.9–10.3)
Chloride: 105 mmol/L (ref 101–111)
Creatinine, Ser: 0.87 mg/dL (ref 0.61–1.24)
GFR calc Af Amer: >60 mL/min
GFR calc non Af Amer: >60 mL/min
Glucose, Bld: 107 mg/dL — ABNORMAL HIGH (ref 65–99)
Potassium: 4 mmol/L (ref 3.5–5.1)
Sodium: 143 mmol/L (ref 135–145)
Total Bilirubin: 0.7 mg/dL (ref 0.3–1.2)
Total Protein: 6.8 g/dL (ref 6.5–8.1)

## 2018-04-05 LAB — CBC
HCT: 41.6 % (ref 39.0–52.0)
Hemoglobin: 14 g/dL (ref 13.0–17.0)
MCH: 30.8 pg (ref 26.0–34.0)
MCHC: 33.7 g/dL (ref 30.0–36.0)
MCV: 91.4 fL (ref 78.0–100.0)
PLATELETS: 194 10*3/uL (ref 150–400)
RBC: 4.55 MIL/uL (ref 4.22–5.81)
RDW: 15.1 % (ref 11.5–15.5)
WBC: 11.7 10*3/uL — AB (ref 4.0–10.5)

## 2018-04-05 NOTE — ED Triage Notes (Signed)
Pt stated "I was brought here by [facility].  I just left [facility].  I don't want to be here anymore.  I'm a cutter.  I came up here from [place], [place] for a job several years ago."

## 2018-04-05 NOTE — ED Notes (Signed)
Pt commited verbally to No Harm agreement.
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
## 2018-04-06 ENCOUNTER — Emergency Department (HOSPITAL_COMMUNITY)
Admission: EM | Admit: 2018-04-06 | Discharge: 2018-04-07 | Disposition: A | Discharge status: Home / Self Care | Payer: Self-pay | Attending: Emergency Medicine | Admitting: Emergency Medicine

## 2018-04-06 ENCOUNTER — Encounter (HOSPITAL_COMMUNITY): Payer: Self-pay | Admitting: Family Medicine

## 2018-04-06 DIAGNOSIS — Y929 Unspecified place or not applicable: Secondary | ICD-10-CM | POA: Insufficient documentation

## 2018-04-06 DIAGNOSIS — F1414 Cocaine abuse with cocaine-induced mood disorder: Secondary | ICD-10-CM | POA: Diagnosis present

## 2018-04-06 DIAGNOSIS — T1491XA Suicide attempt, initial encounter: Secondary | ICD-10-CM | POA: Insufficient documentation

## 2018-04-06 DIAGNOSIS — T50901A Poisoning by unspecified drugs, medicaments and biological substances, accidental (unintentional), initial encounter: Secondary | ICD-10-CM | POA: Insufficient documentation

## 2018-04-06 DIAGNOSIS — F1210 Cannabis abuse, uncomplicated: Secondary | ICD-10-CM | POA: Insufficient documentation

## 2018-04-06 DIAGNOSIS — F17210 Nicotine dependence, cigarettes, uncomplicated: Secondary | ICD-10-CM | POA: Insufficient documentation

## 2018-04-06 DIAGNOSIS — Y998 Other external cause status: Secondary | ICD-10-CM | POA: Insufficient documentation

## 2018-04-06 DIAGNOSIS — X58XXXA Exposure to other specified factors, initial encounter: Secondary | ICD-10-CM | POA: Insufficient documentation

## 2018-04-06 DIAGNOSIS — Z856 Personal history of leukemia: Secondary | ICD-10-CM | POA: Insufficient documentation

## 2018-04-06 DIAGNOSIS — Y9389 Activity, other specified: Secondary | ICD-10-CM | POA: Insufficient documentation

## 2018-04-06 DIAGNOSIS — Z79899 Other long term (current) drug therapy: Secondary | ICD-10-CM | POA: Insufficient documentation

## 2018-04-06 DIAGNOSIS — F1510 Other stimulant abuse, uncomplicated: Secondary | ICD-10-CM | POA: Insufficient documentation

## 2018-04-06 LAB — CBC
HCT: 44 % (ref 39.0–52.0)
Hemoglobin: 14.7 g/dL (ref 13.0–17.0)
MCH: 30.8 pg (ref 26.0–34.0)
MCHC: 33.4 g/dL (ref 30.0–36.0)
MCV: 92.2 fL (ref 78.0–100.0)
PLATELETS: 188 10*3/uL (ref 150–400)
RBC: 4.77 MIL/uL (ref 4.22–5.81)
RDW: 15 % (ref 11.5–15.5)
WBC: 8.1 10*3/uL (ref 4.0–10.5)

## 2018-04-06 LAB — RAPID URINE DRUG SCREEN, HOSP PERFORMED
Amphetamines: NOT DETECTED
Barbiturates: NOT DETECTED
Benzodiazepines: NOT DETECTED
Cocaine: POSITIVE — AB
OPIATES: NOT DETECTED
Tetrahydrocannabinol: POSITIVE — AB

## 2018-04-06 LAB — COMPREHENSIVE METABOLIC PANEL
ALBUMIN: 4.1 g/dL (ref 3.5–5.0)
ALT: 22 U/L (ref 17–63)
ANION GAP: 7 (ref 5–15)
AST: 13 U/L — AB (ref 15–41)
Alkaline Phosphatase: 45 U/L (ref 38–126)
BILIRUBIN TOTAL: 1.1 mg/dL (ref 0.3–1.2)
BUN: 22 mg/dL — AB (ref 6–20)
CHLORIDE: 103 mmol/L (ref 101–111)
CO2: 28 mmol/L (ref 22–32)
Calcium: 9 mg/dL (ref 8.9–10.3)
Creatinine, Ser: 0.65 mg/dL (ref 0.61–1.24)
GFR calc Af Amer: >60 mL/min (ref >60)
GLUCOSE: 139 mg/dL — AB (ref 65–99)
Potassium: 3.8 mmol/L (ref 3.5–5.1)
Sodium: 138 mmol/L (ref 135–145)
TOTAL PROTEIN: 6.8 g/dL (ref 6.5–8.1)

## 2018-04-06 LAB — CBG MONITORING, ED: Glucose-Capillary: 178 mg/dL — ABNORMAL HIGH (ref 65–99)

## 2018-04-06 LAB — SALICYLATE LEVEL
Salicylate Lvl: <7 mg/dL (ref 2.8–30.0)
Salicylate Lvl: <7 mg/dL (ref 2.8–30.0)

## 2018-04-06 LAB — ETHANOL

## 2018-04-06 LAB — ACETAMINOPHEN LEVEL

## 2018-04-06 MED ORDER — SODIUM CHLORIDE 0.9 % IV SOLN
INTRAVENOUS | Status: DC
  Administered 2018-04-06: 23:00:00 via INTRAVENOUS

## 2018-04-06 MED ORDER — HYDROXYZINE HCL 25 MG PO TABS: 25.0000 mg | ORAL_TABLET | Freq: Four times a day (QID) | ORAL | Status: DC | PRN

## 2018-04-06 MED ORDER — ARIPIPRAZOLE 5 MG PO TABS: 5.0000 mg | ORAL_TABLET | Freq: Every day | ORAL | Status: DC

## 2018-04-06 MED ORDER — SODIUM CHLORIDE 0.9 % IV BOLUS
1000.0000 mL | Freq: Once | INTRAVENOUS | Status: AC
  Administered 2018-04-06: 1000 mL via INTRAVENOUS

## 2018-04-06 MED ORDER — TRAZODONE HCL 50 MG PO TABS: 50.0000 mg | ORAL_TABLET | Freq: Every evening | ORAL | Status: DC | PRN

## 2018-04-06 MED ORDER — CITALOPRAM HYDROBROMIDE 10 MG PO TABS
10.0000 mg | ORAL_TABLET | Freq: Every day | ORAL | Status: DC
  Administered 2018-04-06: 10 mg via ORAL
  Filled 2018-04-06: qty 1

## 2018-04-06 NOTE — ED Notes (Signed)
Bed: [facility] Expected date:  Expected time:  Means of arrival:  Comments: Hold for room 31

## 2018-04-06 NOTE — BH Assessment (Signed)
BHH Assessment Progress Note  Per Jacqueline Norman, DO, this pt does not require psychiatric hospitalization at this time.  Pt is to be discharged from [facility] with recommendation to follow up with [facility].  This has been included in pt's discharge instructions.  Pt would also benefit from seeing Peer Support Specialists; they will be asked to speak to pt.  Pt's nurse, Diane, has been notified.  Rick Biggs, MA Triage Specialist [phone]     
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
## 2018-04-06 NOTE — ED Provider Notes (Addendum)
Agency DEPT Provider Note   CSN: [id number] Arrival date & time: 04/06/18  2056     History   Chief Complaint Chief Complaint  Patient presents with  . Drug Overdose    HPI Rick Bray is a 32 y.o. male.  Patient evaluated June 11 through June 12 was discharged home was denying any suicidal homicidal ideations.  Patient returns here tonight states that he took about 20 tablets of 50 mg of trazodone 1 of his medications at about 9 PM.  Patient states this was an intentional suicide attempt.  And he is feeling suicidal.  Patient denies any nausea vomiting.  Denies taking anything else.     Past Medical History:  Diagnosis Date  . 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 MDD (major depressive disorder)   . Schizoaffective disorder [facility])     Patient Active Problem List   Diagnosis Date Noted  . Cocaine abuse with cocaine-induced mood disorder ([place]) 04/06/2018    Past Surgical History:  Procedure Laterality Date  . Port a cath removed           Home Medications    Prior to Admission medications   Medication Sig Start Date End Date Taking? Authorizing Provider  ARIPiprazole (ABILIFY) 5 MG tablet Take 1 tablet (5 mg total) by mouth at bedtime. For mood control 03/31/18  Yes Money, Lowry Ram, FNP  citalopram (CELEXA) 10 MG tablet Take 1 tablet (10 mg total) by mouth daily. For mood control 03/31/18  Yes Money, Lowry Ram, FNP  hydrOXYzine (ATARAX/VISTARIL) 25 MG tablet Take 1 tablet (25 mg total) by mouth every 6 (six) hours as needed for anxiety. 03/31/18  Yes Money, Lowry Ram, FNP  traZODone (DESYREL) 50 MG tablet Take 1 tablet (50 mg total) by mouth at bedtime as needed for sleep. 03/31/18  Yes Money, Lowry Ram, FNP  doxycycline (VIBRA-TABS) 100 MG tablet Take 1 tablet (100 mg total) by mouth every 12 (twelve) hours. Patient not taking: Reported on 04/05/2018 03/31/18   Money, Lowry Ram, FNP    Family History History reviewed. No pertinent family  history.  Social History Social History   Tobacco Use  . Smoking status: Current Every Day Smoker    Packs/day: 1.00    Years: 15.00    Pack years: 15.00    Types: Cigarettes  . Smokeless tobacco: Never Used  Substance Use Topics  . Alcohol use: Not Currently  . Drug use: Yes    Types: Marijuana, Methamphetamines    Comment: Last used Meth: 2 weeks ago and Marjuana was yesterday      Allergies   Risperidone and related   Review of Systems Review of Systems  Constitutional: Positive for fatigue. Negative for fever.  HENT: Negative for congestion.   Eyes: Negative for visual disturbance.  Respiratory: Negative for shortness of breath.   Cardiovascular: Negative for chest pain.  Gastrointestinal: Negative for abdominal pain.  Genitourinary: Negative for dysuria.  Musculoskeletal: Negative for back pain.  Skin: Negative for rash.  Neurological: Negative for headaches.  Hematological: Does not bruise/bleed easily.  Psychiatric/Behavioral: Positive for dysphoric mood and suicidal ideas.     Physical Exam Updated Vital Signs BP (!) 106/53   Pulse 72   Temp (!) 97.5 F (36.4 C) (Oral)   Resp 12   Ht 1.676 m (5\' 6" )   Wt 59.6 kg (131 lb 8 oz)   SpO2 96%   BMI 21.22 kg/m   Physical Exam  Constitutional: He is  oriented to person, place, and time. He appears well-developed and well-nourished. No distress.  HENT:  Head: Normocephalic and atraumatic.  Mucous membranes slightly dry.  Eyes: Pupils are equal, round, and reactive to light. Conjunctivae and EOM are normal.  Neck: Normal range of motion. Neck supple.  Cardiovascular: Normal rate and regular rhythm.  Pulmonary/Chest: Effort normal and breath sounds normal. No respiratory distress.  Abdominal: Soft. Bowel sounds are normal. There is no tenderness.  Musculoskeletal: Normal range of motion. He exhibits no edema.  Neurological: He is alert and oriented to person, place, and time. No cranial nerve deficit or  sensory deficit. He exhibits normal muscle tone. Coordination normal.  Skin: Skin is warm. Capillary refill takes less than 2 seconds. No rash noted.  Nursing note and vitals reviewed.    ED Treatments / Results  Labs (all labs ordered are listed, but only abnormal results are displayed) Labs Reviewed  COMPREHENSIVE METABOLIC PANEL - Abnormal; Notable for the following components:      Result Value   Glucose, Bld 139 (*)    BUN 22 (*)    AST 13 (*)    All other components within normal limits  ACETAMINOPHEN LEVEL - Abnormal; Notable for the following components:   Acetaminophen (Tylenol), Serum <10 (*)    All other components within normal limits  CBG MONITORING, ED - Abnormal; Notable for the following components:   Glucose-Capillary 178 (*)    All other components within normal limits  ETHANOL  SALICYLATE LEVEL  CBC  RAPID URINE DRUG SCREEN, HOSP PERFORMED    EKG EKG Interpretation  Date/Time:  Wednesday April 06 2018 21:51:04 EDT Ventricular Rate:  62 PR Interval:    QRS Duration: 94 QT Interval:  426 QTC Calculation: 433 R Axis:   73 Text Interpretation:  Sinus rhythm Borderline short PR interval ST elev, probable normal early repol pattern No significant change since last tracing Confirmed by Fredia Sorrow [phone]) on 04/06/2018 9:58:20 PM   Radiology No results found.  Procedures Procedures (including critical care time)  CRITICAL CARE Performed by: Anyelina Claycomb Total critical care time: 30 minutes Critical care time was exclusive of separately billable procedures and treating other patients. Critical care was necessary to treat or prevent imminent or life-threatening deterioration. Critical care was time spent personally by me on the following activities: development of treatment plan with patient and/or surrogate as well as nursing, discussions with consultants, evaluation of patient's response to treatment, examination of patient, obtaining history from  patient or surrogate, ordering and performing treatments and interventions, ordering and review of laboratory studies, ordering and review of radiographic studies, pulse oximetry and re-evaluation of patient's condition.    Medications Ordered in ED Medications  0.9 %  sodium chloride infusion ( Intravenous New Bag/Given 04/06/18 2300)  sodium chloride 0.9 % bolus 1,000 mL (0 mLs Intravenous Stopped 04/06/18 2334)     Initial Impression / Assessment and Plan / ED Course  I have reviewed the triage vital signs and the nursing notes.  Pertinent labs & imaging results that were available during my care of the patient were reviewed by me and considered in my medical decision making (see chart for details).    Patient with intentional overdose states he took 20 tablets of 50 mg of trazodone at about 9 PM.  We contacted poison control.  They recommended 6 hours of observation.  Patient's initial labs including LFTs without significant abnormalities.  Tylenol level is still pending.  Alcohol is pending.  Urine drug  screen pending.  Patient will be reevaluated at 3 in the morning.  Suspect that he is probably going to clear medically and then since this was suicide attempt will have to have behavioral health order placed.  Just checked on patient here at 2345 he still alert heart rate is 80 and his blood pressures are in the low 814 systolic.  No evidence of any hypotension no evidence of any significant mental status depression.   Final Clinical Impressions(s) / ED Diagnoses   Final diagnoses:  Accidental drug overdose, initial encounter  Suicide attempt [facility])    ED Discharge Orders    None       Fredia Sorrow, MD 04/06/18 2347  Addendum: Patient's alcohol level Tylenol level salicylate level were not elevated.  Patient essentially medically cleared after he has his period of observation.  If everything continues to go well then he can be interviewed by behavioral health due to the  suicidal attempt.   Fredia Sorrow, MD 04/06/18 [phone]

## 2018-04-06 NOTE — ED Notes (Signed)
Pt was awakened, provided more water & encouraged to provide urine sample as ordered by Dr. Tomi Bamberger.
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
## 2018-04-06 NOTE — ED Notes (Signed)
TTS assessment in progress. 

## 2018-04-06 NOTE — ED Notes (Signed)
Spoke with Rick Bray, Rick Bray, about patient's overdose. Recommends Tylenol blood level in 4 hours, another EKG before medically cleared and could administer charcoal if air way is not compromised or lethargic. Monitor for a minium of 6 hours for CNS suppression, hypotension, low heart rate, drowsy, and supportive care.

## 2018-04-06 NOTE — ED Notes (Signed)
Pt discharged home. Discharged instructions read to pt who verbalized understanding. All belongings returned to pt who signed for same. Denies SI/HI, is not delusional and not responding to internal stimuli. Escorted pt to the ED exit.    

## 2018-04-06 NOTE — [facility] Suicide Risk Assessment (Signed)
Suicide Risk Assessment  Discharge Assessment   [facility] Discharge Suicide Risk Assessment   Principal Problem: Cocaine abuse with cocaine-induced mood disorder [facility]) Discharge Diagnoses:  Patient Active Problem List   Diagnosis Date Noted  . Cocaine abuse with cocaine-induced mood disorder [facility]) [F14.14] 04/06/2018    Priority: High    Total Time spent with patient: 45 minutes  Musculoskeletal: Strength & Muscle Tone: within normal limits Gait & Station: normal Patient leans: N/A  Psychiatric Specialty Exam:   Blood pressure 115/78, pulse 62, temperature 98.7 F (37.1 C), temperature source Oral, resp. rate 17, height 5\' 6"  (1.676 m), weight 59.6 kg (131 lb 8 oz), SpO2 97 %.Body mass index is 21.22 kg/m.  General Appearance: Casual  Eye Contact::  Fair  Speech:  Normal Rate409  Volume:  Normal  Mood:  Euthymic  Affect:  Congruent  Thought Process:  Coherent and Descriptions of Associations: Intact  Orientation:  Full (Time, Place, and Person)  Thought Content:  WDL and Logical  Suicidal Thoughts:  No  Homicidal Thoughts:  No  Memory:  Immediate;   Good Recent;   Good Remote;   Good  Judgement:  Fair  Insight:  Fair  Psychomotor Activity:  Normal  Concentration:  Good  Recall:  Good  Fund of Knowledge:Fair  Language: Good  Akathisia:  No  Handed:  Right  AIMS (if indicated):     Assets:  Leisure Time Physical Health Resilience Social Support  Sleep:     Cognition: WNL  ADL's:  Intact   Mental Status Per Nursing Assessment::   On Admission:   32 yo male who presented to the ED after using cocaine and having suicidal ideations.  Today, he denies suicidal/homicidal ideations, hallucinations, and withdrawal symptoms.  He left [facility] earlier this week and went to [facility] but did not like it and left in one day.  Davey returned to using drugs and then came here.  Peer support resources provided and encouraged to use.  Stable for discharge.  Demographic Factors:   Male  Loss Factors: NA  Historical Factors: NA  Risk Reduction Factors:   Sense of responsibility to family, Living with another person, especially a relative, Positive social support and Positive therapeutic relationship  Continued Clinical Symptoms:  None  Cognitive Features That Contribute To Risk:  None    Suicide Risk:  Minimal: No identifiable suicidal ideation.  Patients presenting with no risk factors but with morbid ruminations; may be classified as minimal risk based on the severity of the depressive symptoms    Plan Of Care/Follow-up recommendations:  Activity:  as tolerated Diet:  heart healthy diet  LORD, JAMISON, NP 04/06/2018, 1:58 PM

## 2018-04-06 NOTE — Discharge Instructions (Signed)
To help you maintain a sober lifestyle, a substance abuse treatment program may be beneficial to you.  Contact [facility] at your earliest opportunity to ask about enrolling in their program: ° °     [facility] ([facility] °     [address] °     [place], [place] [address] °     [phone] °     New patients are seen at the walk-in clinic every Tuesday from 9:00 am - 12:00 pm °

## 2018-04-06 NOTE — ED Notes (Signed)
Bed: [facility]PT3 Expected date:  Expected time:  Means of arrival:  Comments: 

## 2018-04-06 NOTE — ED Triage Notes (Signed)
Patient reports he took TRAZODONE 15-20 pills with in the last hour. Patient reports he is suicidal. Patient reports Education officer, museum dropped him off at the front door. Patient was ambulatory from the lobby to the triage room.

## 2018-04-06 NOTE — ED Provider Notes (Addendum)
[place] DEPT Provider Note   CSN: [id number] Arrival date & time: 04/05/18  2113  Time seen 12:15 PM   History   Chief Complaint Chief Complaint  Patient presents with  . Suicidal  . Homeless    HPI Rick Bray is a 32 y.o. male.  HPI when I asked patient why he is here he states "I am sad".  When I asked him why he said he states "my life".  Asking what is going on his life and he just states "horrible".  When asked why his life is horrible he states he has been able to see his kids for 3 years.  He states he has been treated for depression and he was on medication and is on medication but they are not helping.  He states he is never been admitted to psychiatric hospital and he denies any prior history of self-harm however later on he tells me "I am a cutter".  Patient told me he would hurt himself when asked what he would do he states he would cut himself.  Patient was admitted to [facility] and left after 2 days.  When asked why he left early he states "I do not know".  I asked him how he got here from [place] and he states "I was left here".  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  PCP none  Past Medical History:  Diagnosis Date  . Leukemia in remission ([place])   . MDD (major depressive disorder)   . Schizoaffective disorder [facility])     Patient Active Problem List   Diagnosis Date Noted  . MDD (major depressive disorder), recurrent episode, severe ([place]) 03/26/2018    History reviewed. No pertinent surgical history.      Home Medications    Prior to Admission medications   Medication Sig Start Date End Date Taking? Authorizing Provider  ARIPiprazole (ABILIFY) 5 MG tablet Take 1 tablet (5 mg total) by mouth at bedtime. For mood control 03/31/18  Yes Money, Lowry Ram, FNP  citalopram (CELEXA) 10 MG tablet Take 1 tablet (10 mg total) by mouth daily. For mood control 03/31/18  Yes  Money, Lowry Ram, FNP  hydrOXYzine (ATARAX/VISTARIL) 25 MG tablet Take 1 tablet (25 mg total) by mouth every 6 (six) hours as needed for anxiety. 03/31/18  Yes Money, Lowry Ram, FNP  traZODone (DESYREL) 50 MG tablet Take 1 tablet (50 mg total) by mouth at bedtime as needed for sleep. 03/31/18  Yes Money, Lowry Ram, FNP  doxycycline (VIBRA-TABS) 100 MG tablet Take 1 tablet (100 mg total) by mouth every 12 (twelve) hours. Patient not taking: Reported on 04/05/2018 03/31/18   Money, Lowry Ram, FNP    Family History No family history on file.  Social History Social History   Tobacco Use  . Smoking status: Current Every Day Smoker    Packs/day: 1.00    Years: 15.00    Pack years: 15.00    Types: Cigarettes  . Smokeless tobacco: Never Used  Substance Use Topics  . Alcohol use: Yes  . Drug use: Yes    Types: Marijuana, Methamphetamines  homeless   Allergies   Risperidone and related   Review of Systems Review of Systems  All other systems reviewed and are negative.    Physical Exam Updated Vital Signs BP 114/73 (BP Location: Right Arm)   Pulse 74   Temp 98.6 F (37 C) (Oral)  Resp 15   Ht 5\' 6"  (1.676 m)   Wt 59.6 kg (131 lb 8 oz)   SpO2 95%   BMI 21.22 kg/m   Vital signs normal    Physical Exam  Constitutional: He is oriented to person, place, and time. He appears well-developed and well-nourished.  Non-toxic appearance. He does not appear ill. No distress.  HENT:  Head: Normocephalic and atraumatic.  Right Ear: External ear normal.  Left Ear: External ear normal.  Nose: Nose normal. No mucosal edema or rhinorrhea.  Mouth/Throat: Oropharynx is clear and moist and mucous membranes are normal. No dental abscesses or uvula swelling.  Eyes: Pupils are equal, round, and reactive to light. Conjunctivae and EOM are normal.  Neck: Normal range of motion and full passive range of motion without pain. Neck supple.  Cardiovascular: Normal rate, regular rhythm and normal heart  sounds. Exam reveals no gallop and no friction rub.  No murmur heard. Pulmonary/Chest: Effort normal and breath sounds normal. No respiratory distress. He has no wheezes. He has no rhonchi. He has no rales. He exhibits no tenderness and no crepitus.  Abdominal: Soft. Normal appearance and bowel sounds are normal. He exhibits no distension. There is no tenderness. There is no rebound and no guarding.  Musculoskeletal: Normal range of motion. He exhibits no edema or tenderness.  Moves all extremities well.   Neurological: He is alert and oriented to person, place, and time. He has normal strength. No cranial nerve deficit.  Skin: Skin is warm, dry and intact. No rash noted. No erythema. No pallor.  Psychiatric: His mood appears not anxious. His affect is blunt. His speech is delayed. He is slowed.  Nursing note and vitals reviewed.    ED Treatments / Results  Labs (all labs ordered are listed, but only abnormal results are displayed) Results for orders placed or performed during the hospital encounter of 04/05/18  Comprehensive metabolic panel  Result Value Ref Range   Sodium 143 135 - 145 mmol/L   Potassium 4.0 3.5 - 5.1 mmol/L   Chloride 105 101 - 111 mmol/L   CO2 29 22 - 32 mmol/L   Glucose, Bld 107 (H) 65 - 99 mg/dL   BUN 22 (H) 6 - 20 mg/dL   Creatinine, Ser 0.87 0.61 - 1.24 mg/dL   Calcium 9.1 8.9 - 10.3 mg/dL   Total Protein 6.8 6.5 - 8.1 g/dL   Albumin 4.3 3.5 - 5.0 g/dL   AST 17 15 - 41 U/L   ALT 24 17 - 63 U/L   Alkaline Phosphatase 45 38 - 126 U/L   Total Bilirubin 0.7 0.3 - 1.2 mg/dL   GFR calc non Af Amer >60 >60 mL/min   GFR calc Af Amer >60 >60 mL/min   Anion gap 9 5 - 15  Ethanol  Result Value Ref Range   Alcohol, Ethyl (B) <09 <32 mg/dL  Salicylate level  Result Value Ref Range   Salicylate Lvl <3.5 2.8 - 30.0 mg/dL  Acetaminophen level  Result Value Ref Range   Acetaminophen (Tylenol), Serum <10 (L) 10 - 30 ug/mL  cbc  Result Value Ref Range   WBC 11.7  (H) 4.0 - 10.5 K/uL   RBC 4.55 4.22 - 5.81 MIL/uL   Hemoglobin 14.0 13.0 - 17.0 g/dL   HCT 41.6 39.0 - 52.0 %   MCV 91.4 78.0 - 100.0 fL   MCH 30.8 26.0 - 34.0 pg   MCHC 33.7 30.0 - 36.0 g/dL   RDW 15.1  11.5 - 15.5 %   Platelets 194 150 - 400 K/uL  Rapid urine drug screen (hospital performed)  Result Value Ref Range   Opiates NONE DETECTED NONE DETECTED   Cocaine POSITIVE (A) NONE DETECTED   Benzodiazepines NONE DETECTED NONE DETECTED   Amphetamines NONE DETECTED NONE DETECTED   Tetrahydrocannabinol POSITIVE (A) NONE DETECTED   Barbiturates NONE DETECTED NONE DETECTED   Laboratory interpretation all normal except leukocytosis, + UDS for cocaine    EKG None  Radiology No results found.  Procedures Procedures (including critical care time)  Medications Ordered in ED Medications - No data to display   Initial Impression / Assessment and Plan / ED Course  I have reviewed the triage vital signs and the nursing notes.  Pertinent labs & imaging results that were available during my care of the patient were reviewed by me and considered in my medical decision making (see chart for details).     1 AM patient has not provided urine sample   04:15 AM pt still hasn't given a urine sample. TTS consult ordered.   5:50 AM Kendall, TTS, states the recommendation is to have the psychiatrist reevaluate this morning.  Final Clinical Impressions(s) / ED Diagnoses   Final diagnoses:  Depression, unspecified depression type  Suicidal ideations  Cocaine abuse ([place])    Disposition pending  Rolland Porter, MD, Barbette Or, MD 04/06/18 [id number]    Rolland Porter, MD 04/06/18 [phone]

## 2018-04-07 ENCOUNTER — Encounter (HOSPITAL_COMMUNITY): Payer: Self-pay | Admitting: *Deleted

## 2018-04-07 ENCOUNTER — Other Ambulatory Visit: Payer: Self-pay

## 2018-04-07 ENCOUNTER — Inpatient Hospital Stay (HOSPITAL_COMMUNITY)
Admission: AD | Admit: 2018-04-07 | Discharge: 2018-04-13 | DRG: 885 | Disposition: A | Discharge status: Home / Self Care | Payer: No Typology Code available for payment source | Source: Intra-hospital | Attending: Psychiatry | Admitting: Psychiatry

## 2018-04-07 DIAGNOSIS — F1994 Other psychoactive substance use, unspecified with psychoactive substance-induced mood disorder: Secondary | ICD-10-CM

## 2018-04-07 DIAGNOSIS — R825 Elevated urine levels of drugs, medicaments and biological substances: Secondary | ICD-10-CM | POA: Diagnosis not present

## 2018-04-07 DIAGNOSIS — F333 Major depressive disorder, recurrent, severe with psychotic symptoms: Principal | ICD-10-CM | POA: Diagnosis present

## 2018-04-07 DIAGNOSIS — Z818 Family history of other mental and behavioral disorders: Secondary | ICD-10-CM | POA: Diagnosis not present

## 2018-04-07 DIAGNOSIS — Z888 Allergy status to other drugs, medicaments and biological substances status: Secondary | ICD-10-CM | POA: Diagnosis not present

## 2018-04-07 DIAGNOSIS — F419 Anxiety disorder, unspecified: Secondary | ICD-10-CM | POA: Diagnosis present

## 2018-04-07 DIAGNOSIS — F17210 Nicotine dependence, cigarettes, uncomplicated: Secondary | ICD-10-CM | POA: Diagnosis present

## 2018-04-07 DIAGNOSIS — F1290 Cannabis use, unspecified, uncomplicated: Secondary | ICD-10-CM | POA: Diagnosis not present

## 2018-04-07 DIAGNOSIS — F1511 Other stimulant abuse, in remission: Secondary | ICD-10-CM | POA: Diagnosis not present

## 2018-04-07 DIAGNOSIS — Z590 Homelessness: Secondary | ICD-10-CM | POA: Diagnosis not present

## 2018-04-07 DIAGNOSIS — G4700 Insomnia, unspecified: Secondary | ICD-10-CM | POA: Diagnosis present

## 2018-04-07 DIAGNOSIS — R45851 Suicidal ideations: Secondary | ICD-10-CM | POA: Diagnosis present

## 2018-04-07 DIAGNOSIS — F4000 Agoraphobia, unspecified: Secondary | ICD-10-CM | POA: Diagnosis present

## 2018-04-07 DIAGNOSIS — F1414 Cocaine abuse with cocaine-induced mood disorder: Secondary | ICD-10-CM | POA: Diagnosis present

## 2018-04-07 DIAGNOSIS — F4001 Agoraphobia with panic disorder: Secondary | ICD-10-CM | POA: Diagnosis not present

## 2018-04-07 DIAGNOSIS — Z915 Personal history of self-harm: Secondary | ICD-10-CM | POA: Diagnosis not present

## 2018-04-07 DIAGNOSIS — R450 Nervousness: Secondary | ICD-10-CM | POA: Diagnosis not present

## 2018-04-07 DIAGNOSIS — C9591 Leukemia, unspecified, in remission: Secondary | ICD-10-CM | POA: Diagnosis present

## 2018-04-07 DIAGNOSIS — R634 Abnormal weight loss: Secondary | ICD-10-CM | POA: Diagnosis not present

## 2018-04-07 LAB — RAPID URINE DRUG SCREEN, HOSP PERFORMED
Amphetamines: NOT DETECTED
Barbiturates: NOT DETECTED
Benzodiazepines: NOT DETECTED
Cocaine: NOT DETECTED
OPIATES: NOT DETECTED
TETRAHYDROCANNABINOL: POSITIVE — AB

## 2018-04-07 MED ORDER — NICOTINE 14 MG/24HR TD PT24
14.0000 mg | MEDICATED_PATCH | Freq: Every day | TRANSDERMAL | Status: DC
  Filled 2018-04-07: qty 1

## 2018-04-07 MED ORDER — NICOTINE POLACRILEX 2 MG MT GUM
2.0000 mg | CHEWING_GUM | OROMUCOSAL | Status: DC | PRN
  Administered 2018-04-07 – 2018-04-13 (×17): 2 mg via ORAL
  Filled 2018-04-07 (×6): qty 1

## 2018-04-07 MED ORDER — ARIPIPRAZOLE 5 MG PO TABS: 5.0000 mg | ORAL_TABLET | Freq: Every day | ORAL | Status: DC

## 2018-04-07 MED ORDER — ALUM & MAG HYDROXIDE-SIMETH 200-200-20 MG/5ML PO SUSP: 30.0000 mL | Freq: Four times a day (QID) | ORAL | Status: DC | PRN

## 2018-04-07 MED ORDER — RAMELTEON 8 MG PO TABS
8.0000 mg | ORAL_TABLET | Freq: Every day | ORAL | Status: DC
  Administered 2018-04-07 – 2018-04-08 (×2): 8 mg via ORAL
  Filled 2018-04-07 (×6): qty 1

## 2018-04-07 MED ORDER — ACETAMINOPHEN 325 MG PO TABS
650.0000 mg | ORAL_TABLET | Freq: Four times a day (QID) | ORAL | Status: DC | PRN
  Administered 2018-04-09: 650 mg via ORAL
  Filled 2018-04-07: qty 2

## 2018-04-07 MED ORDER — ENSURE ENLIVE PO LIQD
237.0000 mL | Freq: Two times a day (BID) | ORAL | Status: DC
  Administered 2018-04-07 – 2018-04-13 (×12): 237 mL via ORAL

## 2018-04-07 MED ORDER — HYDROXYZINE HCL 25 MG PO TABS: 25.0000 mg | ORAL_TABLET | Freq: Three times a day (TID) | ORAL | Status: DC | PRN

## 2018-04-07 MED ORDER — ARIPIPRAZOLE 5 MG PO TABS
5.0000 mg | ORAL_TABLET | Freq: Every day | ORAL | Status: DC
  Administered 2018-04-07: 5 mg via ORAL
  Filled 2018-04-07 (×4): qty 1

## 2018-04-07 MED ORDER — ACETAMINOPHEN 325 MG PO TABS: 650.0000 mg | ORAL_TABLET | ORAL | Status: DC | PRN

## 2018-04-07 MED ORDER — TRAZODONE HCL 50 MG PO TABS: 50.0000 mg | ORAL_TABLET | Freq: Every evening | ORAL | Status: DC | PRN

## 2018-04-07 MED ORDER — ALUM & MAG HYDROXIDE-SIMETH 200-200-20 MG/5ML PO SUSP
30.0000 mL | ORAL | Status: DC | PRN
  Administered 2018-04-07: 30 mL via ORAL
  Filled 2018-04-07: qty 30

## 2018-04-07 MED ORDER — CITALOPRAM HYDROBROMIDE 10 MG PO TABS
10.0000 mg | ORAL_TABLET | Freq: Every day | ORAL | Status: DC
  Administered 2018-04-07: 10 mg via ORAL
  Filled 2018-04-07: qty 1

## 2018-04-07 MED ORDER — NICOTINE POLACRILEX 2 MG MT GUM
2.0000 mg | CHEWING_GUM | OROMUCOSAL | Status: DC | PRN
  Administered 2018-04-07: 2 mg via ORAL
  Filled 2018-04-07: qty 1

## 2018-04-07 MED ORDER — SODIUM CHLORIDE 0.9 % IV BOLUS
1000.0000 mL | Freq: Once | INTRAVENOUS | Status: AC
  Administered 2018-04-07: 1000 mL via INTRAVENOUS

## 2018-04-07 MED ORDER — MAGNESIUM HYDROXIDE 400 MG/5ML PO SUSP: 30.0000 mL | Freq: Every day | ORAL | Status: DC | PRN

## 2018-04-07 MED ORDER — HYDROXYZINE HCL 25 MG PO TABS: 25.0000 mg | ORAL_TABLET | Freq: Four times a day (QID) | ORAL | Status: DC | PRN

## 2018-04-07 MED ORDER — CITALOPRAM HYDROBROMIDE 10 MG PO TABS
10.0000 mg | ORAL_TABLET | Freq: Every day | ORAL | Status: DC
  Administered 2018-04-08: 10 mg via ORAL
  Filled 2018-04-07 (×3): qty 1

## 2018-04-07 MED ORDER — ONDANSETRON HCL 4 MG PO TABS: 4.0000 mg | ORAL_TABLET | Freq: Three times a day (TID) | ORAL | Status: DC | PRN

## 2018-04-07 NOTE — ED Notes (Signed)
Patient continues to endorse SI/AH and reports hearing voices stating "I'm going to kill you".  Patient denies HI/VH. Plan of care discussed. Encouragement and support provided and safety maintain. Q 15 min safety check in place and video monitoring.

## 2018-04-07 NOTE — ED Notes (Signed)
Report called to [facility] at [place] called for transport.

## 2018-04-07 NOTE — ED Notes (Signed)
Patient has been alert, oriented today and calm and cooperative.

## 2018-04-07 NOTE — Progress Notes (Signed)
Admission note:  Patient is a 32 yo male that was admitted voluntarily after taking 20 tablets of 50 mg trazodone in an intentional suicide attempt.  Patient was just discharged from [facility] on 06/07.  He was referred to [facility] and states, "I was trying to tell them about neurotransmitters and all that.  I was telling them shit.  They just wanted to talk about nutrition and that [place], not about drugs."  Patient left [facility] and did not follow up with [facility].  Patient left [facility] 2 days ago.  He came to the ED and was discharged.  He then proceeded to go home and take an overdose of trazodone.  Patient states, "I only smoke pot."  He was positive for cocaine and thc.  He denies any alcohol use.  Patient is from [place], [place] and has no family support.  He has been sleeping on the street and is homeless.  He is despondent over his finances, homelessness and lack of support.  Patient's only medical hx is leukemia which is in remission.  Patient oriented to room.  Patient continues to reports suicidal ideation, however, he contracts for safety.  Patient hears voices to "to kill me."

## 2018-04-07 NOTE — Progress Notes (Addendum)
Adult Psychoeducational Group Note  Date:  04/07/2018 Time:  10:05 PM  Group Topic/Focus:  Wrap-Up Group:   The focus of this group is to help patients review their daily goal of treatment and discuss progress on daily workbooks.  Participation Level:  Active  Participation Quality:  Appropriate  Affect:  Appropriate  Cognitive:  Appropriate  Insight: Appropriate  Engagement in Group:  Engaged  Modes of Intervention:  Discussion  Additional Comments:  Patient attended group and said that his day was a 4. His coping skills for today was sleeping.    Rick Bray 5/62/5638, 10:05 PM

## 2018-04-07 NOTE — ED Notes (Signed)
Bed: KGY18 Expected date:  Expected time:  Means of arrival:  Comments: Russell, [place]

## 2018-04-07 NOTE — BH Assessment (Signed)
[facility] Assessment Progress Note  Per Buford Dresser, DO, this pt requires psychiatric hospitalization at this time.  Leonia Reader, RN, [facility] has assigned pt to [facility] Rm [id number].  Pt has signed Voluntary Admission and Consent for Treatment, as well as Consent to Release Information to no one, and signed forms have been faxed to [facility].  Pt's nurse, Caren Griffins, has been notified, and agrees to send original paperwork along with pt via [facility], and to call report to [phone].  Jalene Mullet, [place] Coordinator [phone]

## 2018-04-07 NOTE — ED Provider Notes (Signed)
1:16 AM Care assumed from Dr. Rogene Houston, patient with overdose of trazodone.  While being observed, blood pressure did drop to 85 systolic.  He is been given IV fluid bolus.  3:07 AM Blood pressure has responded well to IV fluids.  He is hemodynamically stable.  He is being placed in psychiatric holding for TTS evaluation.   Delora Fuel, MD 57/84/69 [phone]

## 2018-04-07 NOTE — ED Notes (Signed)
Dr. Roxanne Mins made aware of BP

## 2018-04-07 NOTE — ED Notes (Signed)
Spoke with poison control, states that after the 6 hour mark if blood pressure stays around the 90s and does not drop back down they will end his case. Dr. Roxanne Mins made aware.
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
## 2018-04-07 NOTE — ED Notes (Signed)
Patient reports he prefers nicotene gum to the patch.  He reports his skin is sensitive to the patch.

## 2018-04-07 NOTE — Tx Team (Signed)
Initial Treatment Plan 04/07/2018 4:14 PM Rick Bray [id number]    PATIENT STRESSORS: Financial difficulties Medication change or noncompliance Occupational concerns Substance abuse   PATIENT STRENGTHS: Average or above average intelligence Communication skills Physical Health   PATIENT IDENTIFIED PROBLEMS: Depression  Lack of resources  Lack of support  Suicidal Ideation  Recurrent hospitalizations  Homelessness  Noncompliance with follow up appts.         DISCHARGE CRITERIA:  Improved stabilization in mood, thinking, and/or behavior Motivation to continue treatment in a less acute level of care Reduction of life-threatening or endangering symptoms to within safe limits  PRELIMINARY DISCHARGE PLAN: Attend 12-step recovery group Placement in alternative living arrangements  PATIENT/FAMILY INVOLVEMENT: This treatment plan has been presented to and reviewed with the patient, Rick Bray.  The patient and family have been given the opportunity to ask questions and make suggestions.  Zipporah Plants, RN 04/07/2018, 4:14 PM

## 2018-04-08 DIAGNOSIS — R825 Elevated urine levels of drugs, medicaments and biological substances: Secondary | ICD-10-CM

## 2018-04-08 DIAGNOSIS — Z560 Unemployment, unspecified: Secondary | ICD-10-CM

## 2018-04-08 DIAGNOSIS — F419 Anxiety disorder, unspecified: Secondary | ICD-10-CM

## 2018-04-08 DIAGNOSIS — F4001 Agoraphobia with panic disorder: Secondary | ICD-10-CM

## 2018-04-08 DIAGNOSIS — G4700 Insomnia, unspecified: Secondary | ICD-10-CM

## 2018-04-08 DIAGNOSIS — R45851 Suicidal ideations: Secondary | ICD-10-CM

## 2018-04-08 DIAGNOSIS — F1414 Cocaine abuse with cocaine-induced mood disorder: Secondary | ICD-10-CM

## 2018-04-08 DIAGNOSIS — Z590 Homelessness: Secondary | ICD-10-CM

## 2018-04-08 DIAGNOSIS — F1511 Other stimulant abuse, in remission: Secondary | ICD-10-CM

## 2018-04-08 DIAGNOSIS — F1994 Other psychoactive substance use, unspecified with psychoactive substance-induced mood disorder: Secondary | ICD-10-CM

## 2018-04-08 DIAGNOSIS — F17210 Nicotine dependence, cigarettes, uncomplicated: Secondary | ICD-10-CM

## 2018-04-08 DIAGNOSIS — Z818 Family history of other mental and behavioral disorders: Secondary | ICD-10-CM

## 2018-04-08 DIAGNOSIS — R450 Nervousness: Secondary | ICD-10-CM

## 2018-04-08 DIAGNOSIS — F333 Major depressive disorder, recurrent, severe with psychotic symptoms: Principal | ICD-10-CM

## 2018-04-08 DIAGNOSIS — F1290 Cannabis use, unspecified, uncomplicated: Secondary | ICD-10-CM

## 2018-04-08 DIAGNOSIS — Z6332 Other absence of family member: Secondary | ICD-10-CM

## 2018-04-08 DIAGNOSIS — Z915 Personal history of self-harm: Secondary | ICD-10-CM

## 2018-04-08 DIAGNOSIS — R634 Abnormal weight loss: Secondary | ICD-10-CM

## 2018-04-08 MED ORDER — HYDROXYZINE HCL 50 MG PO TABS
50.0000 mg | ORAL_TABLET | Freq: Four times a day (QID) | ORAL | Status: DC | PRN
  Administered 2018-04-09 – 2018-04-11 (×2): 50 mg via ORAL
  Filled 2018-04-08 (×2): qty 1
  Filled 2018-04-08: qty 10

## 2018-04-08 MED ORDER — ARIPIPRAZOLE 10 MG PO TABS
10.0000 mg | ORAL_TABLET | Freq: Every day | ORAL | Status: DC
  Administered 2018-04-08: 10 mg via ORAL
  Filled 2018-04-08 (×3): qty 1

## 2018-04-08 MED ORDER — CITALOPRAM HYDROBROMIDE 20 MG PO TABS
20.0000 mg | ORAL_TABLET | Freq: Every day | ORAL | Status: DC
  Administered 2018-04-09 – 2018-04-13 (×5): 20 mg via ORAL
  Filled 2018-04-08: qty 1
  Filled 2018-04-08: qty 7
  Filled 2018-04-08 (×3): qty 1
  Filled 2018-04-08: qty 7

## 2018-04-08 NOTE — Progress Notes (Signed)
D: Patient denies SI or HI this evening stating, "Ive had no suicidal thoughts this afternoon" but does endorse paranoia and auditory hallucinations.  Pt. States he thinks, "people are after me", reporting that he can hear their voices.  Pt. States that he has previously been on Zyprexa but stopped taking it and cannot say if it really helped his symptoms, "maybe a little".  Pt. Reports that he is "stranded" in [place] and that he is from [place] and has no social support or resources to obtain medication upon discharge.  Pt. States he is hoping to get into an [employer].  Pt. Is visualized in the dayroom interacting with staff and others, he attended evening wrap up group, participated and was cooperative.  A: Patient given emotional support from RN. Patient encouraged to come to staff with concerns and/or questions. Patient's medication routine continued. Patient's orders and plan of care reviewed.   R: Patient remains appropriate and cooperative. Will continue to monitor patient q15 minutes for safety.

## 2018-04-08 NOTE — BHH Group Notes (Signed)
LCSW Group Therapy Note   04/08/2018 1:15pm   Type of Therapy and Topic:  Group Therapy:  Overcoming Obstacles   Participation Level:  Did Not Attend--pt invited. Chose to remain in bed.    Description of Group:    In this group patients will be encouraged to explore what they see as obstacles to their own wellness and recovery. They will be guided to discuss their thoughts, feelings, and behaviors related to these obstacles. The group will process together ways to cope with barriers, with attention given to specific choices patients can make. Each patient will be challenged to identify changes they are motivated to make in order to overcome their obstacles. This group will be process-oriented, with patients participating in exploration of their own experiences as well as giving and receiving support and challenge from other group members.   Therapeutic Goals: 1. Patient will identify personal and current obstacles as they relate to admission. 2. Patient will identify barriers that currently interfere with their wellness or overcoming obstacles.  3. Patient will identify feelings, thought process and behaviors related to these barriers. 4. Patient will identify two changes they are willing to make to overcome these obstacles:      Summary of Patient Progress   x   Therapeutic Modalities:   Cognitive Behavioral Therapy Solution Focused Therapy Motivational Interviewing Relapse Prevention Therapy  Avelina Laine, LCSW 04/08/2018 1:58 PM

## 2018-04-08 NOTE — Progress Notes (Signed)
Psychoeducational Group Note  Date:  04/08/2018 Time:  2326  Group Topic/Focus:  Wrap-Up Group:   The focus of this group is to help patients review their daily goal of treatment and discuss progress on daily workbooks.  Participation Level: Did Not Attend  Participation Quality:  Not Applicable  Affect:  Not Applicable  Cognitive:  Not Applicable  Insight:  Not Applicable  Engagement in Group: Not Applicable  Additional Comments:  The patient refused to attend the evening A.A.meeting and socialized with his roommate.   Archie Balboa S 04/08/2018, 11:26 PM

## 2018-04-08 NOTE — Progress Notes (Addendum)
Patient ID: Rick Bray, male   DOB: 11-Jan-1986, 32 y.o.   MRN: [id number]   Pt currently presents with a flat affect and depressed behavior. Reports ongoing worry. Pt states goal is to "get into to a treatment facility, find somewhere to live." Per report from nightshift, pt interested in going back to [facility] at discharge.   Pt provided with medications per providers orders. Pt's labs and vitals were monitored throughout the night. Pt given a 1:1 about emotional and mental status. Pt supported and encouraged to express concerns and questions. Pt educated on assertiveness techniques.   Pt's safety ensured with 15 minute and environmental checks. Pt currently denies SI/HI and A/V hallucinations. Pt verbally agrees to seek staff if SI/HI or A/VH occurs and to consult with staff before acting on any harmful thoughts. Will continue POC.

## 2018-04-08 NOTE — Progress Notes (Signed)
Nutrition Brief Note  Patient identified on the Malnutrition Screening Tool (MST) Report  Wt Readings from Last 15 Encounters:  04/07/18 131 lb 6.3 oz (59.6 kg)  04/06/18 131 lb 8 oz (59.6 kg)  04/05/18 131 lb 8 oz (59.6 kg)  03/26/18 123 lb (55.8 kg)  03/25/18 130 lb (59 kg)    Body mass index is 21.21 kg/m. Patient meets criteria for normal weight based on current BMI. Skin WDL. Patient admitted following OD of trazodone. Patient is homeless, per notes.   Current diet order is Regular and patient is eating as desired for meals and snacks at this time. Labs and medications reviewed.   Ensure Enlive ordered BID per ONS protocol, each supplement provides 350 kcal and 20 grams of protein. Patient accepted first bottle of this supplement; will maintain order at this time. No additional nutrition interventions warranted at this time. If nutrition issues arise, please consult RD.     Jarome Matin, MS, RD, LDN, [facility] Inpatient Clinical Dietitian Pager # [phone] After hours/weekend pager # [phone]

## 2018-04-08 NOTE — BHH Suicide Risk Assessment (Signed)
[facility] Admission Suicide Risk Assessment   Nursing information obtained from:  Patient Demographic factors:  Male, Caucasian, Low socioeconomic status, Living alone, Unemployed Current Mental Status:  Self-harm behaviors, Belief that plan would result in death, Self-harm thoughts Loss Factors:  Decrease in vocational status, Financial problems / change in socioeconomic status Historical Factors:  Prior suicide attempts Risk Reduction Factors:  NA  Total Time spent with patient: 1 hour Principal Problem: Substance induced mood disorder ([place]) Diagnosis:   Patient Active Problem List   Diagnosis Date Noted  . Substance induced mood disorder ([place]) [F19.94] 04/08/2018  . MDD (major depressive disorder), recurrent, severe, with psychosis ([place]) [F33.3] 04/07/2018  . Cocaine abuse with cocaine-induced mood disorder [facility]) [F14.14] 04/06/2018   Subjective Data:   Rick Bray is a 32 y/o M with psychiatric history of MDD and cocaine use disorder who was admitted voluntarily from [facility] where he presented with worsening symptoms of depression, SI and suicide attempt via overdose of trazodone, and relapse of use of cocaine. Pt has recent history of discharge from [facility] on 03/31/18 directly to [facility] for substance use treatment where he only stayed for 2 days. Pt was medically cleared and then transferred to [facility] for additional treatment and stabilization.  Upon initial presentation pt shares, "For one, I'm stranded in [place]. I'm homeless. I lost my kids. I don't have anywhere to go. I don't have anyone I can talk to. I'm paranoid thinking people are out to get me." Pt was asked about his history after most recent discharge from [facility], and he shares that he went to [facility] but he did not feel it was a good fit due to them speaking about neurotransmitters. Pt then left and went to a shelter where he relapsed on crack cocaine and cannabis. He called his brother asking for a place to stay, and his brother declined, which  worsened pt's depression and pt developed SI, at which time he took overdose of trazodone. However, pt immediately called emergency services after taking the overdose. Pt endorses depression symptoms of anhedonia, guilty feelings, poor sleep, poor concentration,and poor appetite. He denies current SI/HI/AH/VH. He denies symptoms of mania, OCD, and PTSD. He reports using cocaine and cannabis recently, but denies other illicit substance use.  Discussed with patient about treatment options. He agrees to be resumed on previous discharge medications of celexa and abilify at slightly larger doses. He agrees to speak with SW team about referrals to substance use treatment. He was in agreement with the above plan, and he had no further questions, comments, or concerns.  Continued Clinical Symptoms:  Alcohol Use Disorder Identification Test Final Score (AUDIT): 4 The "Alcohol Use Disorders Identification Test", Guidelines for Use in Primary Care, Second Edition.  World Pharmacologist [facility]). Score between 0-7:  no or low risk or alcohol related problems. Score between 8-15:  moderate risk of alcohol related problems. Score between 16-19:  high risk of alcohol related problems. Score 20 or above:  warrants further diagnostic evaluation for alcohol dependence and treatment.   CLINICAL FACTORS:   Severe Anxiety and/or Agitation Depression:   Comorbid alcohol abuse/dependence Alcohol/Substance Abuse/Dependencies More than one psychiatric diagnosis Unstable or Poor Therapeutic Relationship Previous Psychiatric Diagnoses and Treatments   Musculoskeletal: Strength & Muscle Tone: within normal limits Gait & Station: normal Patient leans: N/A  Psychiatric Specialty Exam: Physical Exam  Nursing note and vitals reviewed.   Review of Systems  Constitutional: Negative for chills and fever.  Respiratory: Negative for cough and shortness of  breath.   Cardiovascular: Negative for chest pain.   Gastrointestinal: Negative for abdominal pain, heartburn, nausea and vomiting.  Psychiatric/Behavioral: Positive for depression, substance abuse and suicidal ideas. Negative for hallucinations. The patient is nervous/anxious. The patient does not have insomnia.     Blood pressure 105/81, pulse 70, temperature 98 F (36.7 C), temperature source Oral, resp. rate 20, height 5\' 6"  (1.676 m), weight 59.6 kg (131 lb 6.3 oz), SpO2 99 %.Body mass index is 21.21 kg/m.  General Appearance: Casual and Fairly Groomed  Eye Contact:  Good  Speech:  Clear and Coherent and Normal Rate  Volume:  Normal  Mood:  Anxious and Depressed  Affect:  Congruent and Constricted  Thought Process:  Coherent and Goal Directed  Orientation:  Full (Time, Place, and Person)  Thought Content:  Logical  Suicidal Thoughts:  No  Homicidal Thoughts:  No  Memory:  Immediate;   Fair Recent;   Fair Remote;   Fair  Judgement:  Poor  Insight:  Lacking  Psychomotor Activity:  Normal  Concentration:  Concentration: Fair  Recall:  [employer] of Knowledge:  Fair  Language:  Fair  Akathisia:  No  Handed:    AIMS (if indicated):     Assets:  Resilience  ADL's:  Intact  Cognition:  WNL  Sleep:  Number of Hours: 6.5      COGNITIVE FEATURES THAT CONTRIBUTE TO RISK:  None    SUICIDE RISK:   Moderate:  Frequent suicidal ideation with limited intensity, and duration, some specificity in terms of plans, no associated intent, good self-control, limited dysphoria/symptomatology, some risk factors present, and identifiable protective factors, including available and accessible social support.  PLAN OF CARE:   -Admit to inpatient level of care  -MDD, recurrent, severe, with psychosis   -Resume celexa 20mg  po qDay  -Resume abilify 10mg  po qDay  -Insomnia   -Continue rozerem 8mg  po qhs   -Anxiety    -Continue vistaril 50mg  po q6h prn anxiety/sleep  -Encourage participation in groups and therapeutic  milieu  -disposition planning will be ongoing  I certify that inpatient services furnished can reasonably be expected to improve the patient's condition.   Pennelope Bracken, MD 04/08/2018, 2:58 PM

## 2018-04-08 NOTE — Progress Notes (Signed)
Recreation Therapy Notes  Date: 6.14.19 Time: 0930 Location: [address] Dayroom  Group Topic: Stress Management  Goal Area(s) Addresses:  Patient will verbalize importance of using healthy stress management.  Patient will identify positive emotions associated with healthy stress management.   Intervention: Stress Management  Activity :  Progressive Muscle Relaxation.  LRT introduced the stress management technique of progressive muscle relaxation.  LRT read a script to guide patients in doing the activity.  Patients were to follow along as script was read to engage in activity.  Education:  Stress Management, Discharge Planning.   Education Outcome: Acknowledges edcuation/In group clarification offered/Needs additional education  Clinical Observations/Feedback: Pt did not attend group.      Victorino Sparrow, LRT/CTRS         Ria Comment, Wylan Gentzler A 04/08/2018 11:18 AM

## 2018-04-08 NOTE — BHH Counselor (Signed)
Adult Comprehensive Assessment  Patient ID: Rick Bray, male   DOB: 05/11/1986, 32 y.o.   MRN: [id number]  Information Source: Information source: Patient        Current Stressors: Patient states their primary concerns and needs for treatment are:: "A structured environment" Patient states their goals for this hospitilization and ongoing recovery are:: "To get somewhere I can live a sober life." Educational / Learning stressors: Denies stressors Employment / Job issues: Denies stressors Family Relationships: Has lost family members and does not have a relationship with remaining family. Has not seen 3 sons in 4 years. Financial / Lack of resources (include bankruptcy): Supposed to be on disability, which was cut off when he went to prison, and has not been restarted since he got out in November 2016. He has applied, and the decision date is coming up June 4th. Housing / Lack of housing: Has no money to pay for housing. Has been staying on the streets. Physical health (include injuries & life threatening diseases): Denies stressors. Social relationships: Anger issues with girlforend. Substance abuse: Affects his whole life. Bereavement / Loss: Stepbrother hung himself in 2011/02/18.  Living/Environment/Situation: Living Arrangements: Other (Comment) Living conditions (as described by patient or guardian): Homeless, staying on the streets Who else lives in the home?: Nobody How long has patient lived in current situation?: Almost 5 months What is atmosphere in current home: Chaotic, Temporary, Dangerous  Family History: Marital status: Long term relationship Long term relationship, how long?: 3 months What types of issues is patient dealing with in the relationship?: Anger, and when he tries to tell her how he feels, she gets even angrier Are you sexually active?: Yes What is your sexual orientation?: Heterosexual Has your sexual activity been affected by drugs, alcohol, medication,  or emotional stress?: None Does patient have children?: Yes How many children?: 3 How is patient's relationship with their children?: 17yo, 11yo, and 8yo - has no relationship with any of them  Childhood History: By whom was/is the patient raised?: Grandparents Additional childhood history information: Mother was an alcoholic. Father had Schizophrenia. Therefore, was raised by paternal grandmother. Description of patient's relationship with caregiver when they were a child: Grandmother - Good relationship, very caring. Mother - would see her when she was not drunk. Father - no relationship Patient's description of current relationship with people who raised him/her: Grandmother - deceased, Mother - deceased, Father - stil living, but no relationship How were you disciplined when you got in trouble as a child/adolescent?: "I wasn't. I should have been disciplined more." Does patient have siblings?: Yes Number of Siblings: 2 Description of patient's current relationship with siblings: Half-sister (very little contact), step-brother (deceased by suicide in 02-18-2011, had a good relationship but they did drugs together, then he hung himself) Did patient suffer any verbal/emotional/physical/sexual abuse as a child?: No Did patient suffer from severe childhood neglect?: No Has patient ever been sexually abused/assaulted/raped as an adolescent or adult?: No Was the patient ever a victim of a crime or a disaster?: No Witnessed domestic violence?: No Has patient been effected by domestic violence as an adult?: No  Education: Highest grade of school patient has completed: 9th grade Currently a student?: No Learning disability?: Yes What learning problems does patient have?: ADHD  Employment/Work Situation: Employment situation: Unemployed(Previously had disability for Schizoaffective/Depressed type, is awaiting 03/29/18 decision on his new application.) What is the longest time patient has a  held a job?: 4 years Where was the patient employed at that time?:  [employer] Are There Guns or Other Weapons in [place]?: No  Financial Resources: Financial resources: No income(No insurance) Does patient have a Programmer, applications or guardian?: No  Alcohol/Substance Abuse: What has been your use of drugs/alcohol within the last 12 months?: Methamphetamine (daily), marijuana (daily), heroin (about 1 week only), alcohol (just when coming off meth) Alcohol/Substance Abuse Treatment Hx: Past Tx, Inpatient If yes, describe treatment: Inpatient in [place], has been to AA/NA in the past. [facility].  Has alcohol/substance abuse ever caused legal problems?: No  Social Support System: Heritage manager System: None Describe Community Support System: N/A Type of faith/religion: Darrick Meigs How does patient's faith help to cope with current illness?: "I don't."  Leisure/Recreation: Leisure and Hobbies: Listen to music  Strengths/Needs: What is the patient's perception of their strengths?: Good daddy when he has his kids, did not do drugs except smoke a blunt of marijuana. "When they left I gave up."  Patient states they can use these personal strengths during their treatment to contribute to their recovery: "I can't. They're not there."  Patient states these barriers may affect/interfere with their treatment: Homelessness Patient states these barriers may affect their return to the community: Homelessness Other important information patient would like considered in planning for their treatment: "I need to go to a drug treatment place."  Discharge Plan: Currently receiving community mental health services: No Patient states concerns and preferences for aftercare planning are: States he would like to go to [facility], have psychiatric follow-up and live in a recovery house. Patient states they will know when they are safe and ready for discharge when:  "When I have a structured environment to go to and I know I can be off drugs." Does patient have access to transportation?: No Does patient have financial barriers related to discharge medications?: Yes Patient description of barriers related to discharge medications: No income, no insurance Plan for no access to transportation at discharge: Transportation is not available, will need to be explored. Plan for living situation after discharge: Would like to go to a recovery home, also has mentioned drug rehab. Will patient be returning to same living situation after discharge?: No              Summary/Recommendations:   Summary and Recommendations (to be completed by the evaluator): Patient is 32yo male who identifies as homeless in [place], [place]. Patient has a primary diagnosis of MDD, cocaine use disorder, and marijuana use disorder. He was last admitted to 03/26/18 with similar presentation and discharged to [facility]. Patient reports that he left [facility] because he did not like the groups. Recommendatios for patient include: medication management/detox, mood stabilization, and development of comprehensive mental wellness/sobriety plan. CSW assessing for appropriate referrals.    Avelina Laine LCSW 04/08/2018 12:55 PM

## 2018-04-08 NOTE — BHH Suicide Risk Assessment (Signed)
[facility] [facility] INPATIENT:  Family/Significant Other Suicide Prevention Education  Suicide Prevention Education:  Patient Refusal for Family/Significant Other Suicide Prevention Education: The patient Rick Bray has refused to provide written consent for family/significant other to be provided Family/Significant Other Suicide Prevention Education during admission and/or prior to discharge.  Physician notified.  SPE completed with pt, as pt refused to consent to family contact. SPI pamphlet provided to pt and pt was encouraged to share information with support network, ask questions, and talk about any concerns relating to SPE. Pt denies access to guns/firearms and verbalized understanding of information provided. Mobile Crisis information also provided to pt.   Avelina Laine LCSW 04/08/2018, 12:59 PM

## 2018-04-08 NOTE — H&P (Addendum)
Psychiatric Admission Assessment Adult  Patient Identification: Deyton Ellenbecker  MRN:  [id number]  Date of Evaluation:  04/08/2018  Chief Complaint: Worsening depression, relapse on cocaine triggering suicidal thoughts  Principal Diagnosis: Substance induced mood disorder.  Diagnosis:   Patient Active Problem List   Diagnosis Date Noted  . Substance induced mood disorder ([place]) [F19.94] 04/08/2018    Priority: High  . MDD (major depressive disorder), recurrent, severe, with psychosis ([place]) [F33.3] 04/07/2018    Priority: High  . Cocaine abuse with cocaine-induced mood disorder [facility]) [F14.14] 04/06/2018    Priority: Medium   History of Present Illness: This is one of several admission  assessments for this 32 year old male, who is currently homeless & has drug problems. He was recently a patient in this [facility], discharged & sent to [facility]. He is being re-admitted from the [facility] ED with complaints of worsening depression, suicidal ideations & attempt by overdose. He was brought back to the [facility] for further evaluation & treatment. His UDS was positive for Cocaine & THC.  During this assessment, pleasant bensinger presents, "I was discharged from this hospital & sent to [facility]. I stayed at [facility] for 2 days. I felt like [facility] was not doing nothing for me. All they talked about was neurotransmitters & testing for HIV. They were not saying nothing about drug addiction. I left [facility] after 2 days, went to [place], stayed there for 2 days because I was homeless. I called my brother to see if he will help me with a bus ticket, he hung up on me. It was raining heavily too on that day. I got very depressed, became suicidal & took an overdose of a bunch of medicines to kill myself. But, I got scared & called 911 & they brought me to the hospital. I am wondering if I can go back to [facility] or something to try treatment again because I relapsed on cocaine. I think my medicines need to be adjusted".  Associated  Signs/Symptoms:  Depression Symptoms:  depressed mood, insomnia, anxiety,  (Hypo) Manic Symptoms: Impulsivity.  Anxiety Symptoms: Excessive worrying.  Psychotic Symptoms: Denies any hallucinations, delusions or paranoia.  PTSD Symptoms: None reported.  Total Time spent with patient: 1 hour  Past Psychiatric History: He reports a history of depression, Two prior psychiatric admissions approximately, January of this year, 2019 [facility]) & 3 years ago.  Reports prior suicide attempts by overdosing in 2014 and by cutting himself in 2016.  He reports history of cutting and has old scars on forearms.  Endorses excessive worrying/anxiety sometimes leading to panic symptoms.  Also acknowledges agoraphobia.  States that he has been told he has "schizoaffective" in the past but is not currently endorsing any history of psychosis  Is the patient at risk to self? No.  Has the patient been a risk to self in the past 6 months? Yes.    Has the patient been a risk to self within the distant past? Yes.    Is the patient a risk to others? No.  Has the patient been a risk to others in the past 6 months? No.  Has the patient been a risk to others within the distant past? No.   Prior Inpatient Therapy: Yes, [facility]) Prior Outpatient Therapy: Yes.  Alcohol Screening: 1. How often do you have a drink containing alcohol?: 2 to 4 times a month 2. How many drinks containing alcohol do you have on a typical day when you are drinking?: 3 or 4 3. How  often do you have six or more drinks on one occasion?: Less than monthly AUDIT-C Score: 4 4. How often during the last year have you found that you were not able to stop drinking once you had started?: Never 5. How often during the last year have you failed to do what was normally expected from you becasue of drinking?: Never 6. How often during the last year have you needed a first drink in the morning to get yourself going after a heavy drinking session?: Never 7. How  often during the last year have you had a feeling of guilt of remorse after drinking?: Never 8. How often during the last year have you been unable to remember what happened the night before because you had been drinking?: Never 9. Have you or someone else been injured as a result of your drinking?: No 10. Has a relative or friend or a doctor or another health worker been concerned about your drinking or suggested you cut down?: No Alcohol Use Disorder Identification Test Final Score (AUDIT): 4 Intervention/Follow-up: AUDIT Score <7 follow-up not indicated  Substance Abuse History in the last 12 months: Denies alcohol abuse, reports history of methamphetamine/stimulant abuse and identifies this substance has drug of choice, cannabis abuse, uses on most days.  Consequences of Substance Abuse: Yes  Previous Psychotropic Medications: Remembers having been on Celexa and on Abilify in the past.  Has not been on any psychiatric medications prior to this year.   Psychological Evaluations: No  Past Medical History: Currently denies history of any medical illnesses, not allergic to any medications, smokes a pack of cigarettes per day. Past Medical History:  Diagnosis Date  . Leukemia in remission ([place])   . MDD (major depressive disorder)   . Schizoaffective disorder [facility])     Past Surgical History:  Procedure Laterality Date  . Port a cath removed      Family History: Mother deceased from complications of COPD. Father alive, limited contact. Has 1 sister brother. States his father was diagnosed with schizophrenia.  Family Psychiatric  History: Reports father was diagnosed with schizophrenia.  Tobacco Screening: Have you used any form of tobacco in the last 30 days? (Cigarettes, Smokeless Tobacco, Cigars, and/or Pipes): Yes Tobacco use, Select all that apply: 5 or more cigarettes per day Are you interested in Tobacco Cessation Medications?: Yes, will notify MD for an order Counseled patient on  smoking cessation including recognizing danger situations, developing coping skills and basic information about quitting provided: Yes  Social History: 32 year old, single, has 3 children ages 85, 71, 37 years old.  Children live with her mother.  Patient is currently homeless.  Denies legal issues.  Currently unemployed. Social History   Substance and Sexual Activity  Alcohol Use Not Currently     Social History   Substance and Sexual Activity  Drug Use Yes  . Types: Marijuana, Methamphetamines   Comment: Last used Meth: 2 weeks ago and Marjuana was yesterday     Additional Social History:  Allergies:   Allergies  Allergen Reactions  . Risperidone And Related     Faint   Lab Results:  Results for orders placed or performed during the hospital encounter of 04/06/18 (from the past 48 hour(s))  Rapid urine drug screen (hospital performed)     Status: Abnormal   Collection Time: 04/06/18  9:41 PM  Result Value Ref Range   Opiates NONE DETECTED NONE DETECTED   Cocaine NONE DETECTED NONE DETECTED   Benzodiazepines NONE DETECTED NONE  DETECTED   Amphetamines NONE DETECTED NONE DETECTED   Tetrahydrocannabinol POSITIVE (A) NONE DETECTED   Barbiturates NONE DETECTED NONE DETECTED    Comment: (NOTE) DRUG SCREEN FOR MEDICAL PURPOSES ONLY.  IF CONFIRMATION IS NEEDED FOR ANY PURPOSE, NOTIFY LAB WITHIN 5 DAYS. LOWEST DETECTABLE LIMITS FOR URINE DRUG SCREEN Drug Class                     Cutoff (ng/mL) Amphetamine and metabolites    1000 Barbiturate and metabolites    200 Benzodiazepine                 893 Tricyclics and metabolites     300 Opiates and metabolites        300 Cocaine and metabolites        300 THC                            50 Performed at [facility], [place] [address], [place], [place] [address]   Comprehensive metabolic panel     Status: Abnormal   Collection Time: 04/06/18 10:06 PM  Result Value Ref Range   Sodium 138 135 - 145 mmol/L    Potassium 3.8 3.5 - 5.1 mmol/L   Chloride 103 101 - 111 mmol/L   CO2 28 22 - 32 mmol/L   Glucose, Bld 139 (H) 65 - 99 mg/dL   BUN 22 (H) 6 - 20 mg/dL   Creatinine, Ser 0.65 0.61 - 1.24 mg/dL   Calcium 9.0 8.9 - 10.3 mg/dL   Total Protein 6.8 6.5 - 8.1 g/dL   Albumin 4.1 3.5 - 5.0 g/dL   AST 13 (L) 15 - 41 U/L   ALT 22 17 - 63 U/L   Alkaline Phosphatase 45 38 - 126 U/L   Total Bilirubin 1.1 0.3 - 1.2 mg/dL   GFR calc non Af Amer >60 >60 mL/min   GFR calc Af Amer >60 >60 mL/min    Comment: (NOTE) The eGFR has been calculated using the CKD EPI equation. This calculation has not been validated in all clinical situations. eGFR's persistently <60 mL/min signify possible Chronic Kidney Disease.    Anion gap 7 5 - 15    Comment: Performed at [facility], [place] [address], [place], [place] [address]  Ethanol     Status: None   Collection Time: 04/06/18 10:06 PM  Result Value Ref Range   Alcohol, Ethyl (B) <10 <10 mg/dL    Comment: (NOTE) Lowest detectable limit for serum alcohol is 10 mg/dL. For medical purposes only. Performed at [facility], [place] [address], [place], [place] [address]   Salicylate level     Status: None   Collection Time: 04/06/18 10:06 PM  Result Value Ref Range   Salicylate Lvl <8.2 2.8 - 30.0 mg/dL    Comment: Performed at [facility], [place] [address] [place], [place] [address]  Acetaminophen level     Status: Abnormal   Collection Time: 04/06/18 10:06 PM  Result Value Ref Range   Acetaminophen (Tylenol), Serum <10 (L) 10 - 30 ug/mL    Comment: (NOTE) Therapeutic concentrations vary significantly. A range of 10-30 ug/mL  may be an effective concentration for many patients. However, some  are best treated at concentrations outside of this range. Acetaminophen concentrations >150 ug/mL at 4 hours after ingestion  and >50 ug/mL at 12 hours after ingestion are often associated with  toxic  reactions. Performed at [facility], [place] [address] [place], [place] [address]   cbc     Status: None   Collection Time: 04/06/18 10:06 PM  Result Value Ref Range   WBC 8.1 4.0 - 10.5 K/uL   RBC 4.77 4.22 - 5.81 MIL/uL   Hemoglobin 14.7 13.0 - 17.0 g/dL   HCT 44.0 39.0 - 52.0 %   MCV 92.2 78.0 - 100.0 fL   MCH 30.8 26.0 - 34.0 pg   MCHC 33.4 30.0 - 36.0 g/dL   RDW 15.0 11.5 - 15.5 %   Platelets 188 150 - 400 K/uL    Comment: Performed at [facility], [place] [address], [place], [place] [address]  CBG monitoring, ED     Status: Abnormal   Collection Time: 04/06/18 10:57 PM  Result Value Ref Range   Glucose-Capillary 178 (H) 65 - 99 mg/dL   Blood Alcohol level:  Lab Results  Component Value Date   ETH <10 04/06/2018   ETH <10 60/63/0160   Metabolic Disorder Labs:  Lab Results  Component Value Date   HGBA1C 5.2 03/27/2018   MPG 102.54 03/27/2018   No results found for: PROLACTIN Lab Results  Component Value Date   CHOL 128 03/27/2018   TRIG 52 03/27/2018   HDL 49 03/27/2018   CHOLHDL 2.6 03/27/2018   VLDL 10 03/27/2018   LDLCALC 69 03/27/2018   Current Medications: Current Facility-Administered Medications  Medication Dose Route Frequency Provider Last Rate Last Dose  . acetaminophen (TYLENOL) tablet 650 mg  650 mg Oral Q6H PRN Ethelene Hal, NP      . alum & mag hydroxide-simeth (MAALOX/MYLANTA) 200-200-20 MG/5ML suspension 30 mL  30 mL Oral Q4H PRN Ethelene Hal, NP   30 mL at 04/07/18 2226  . ARIPiprazole (ABILIFY) tablet 5 mg  5 mg Oral QHS Ethelene Hal, NP   5 mg at 04/07/18 2139  . citalopram (CELEXA) tablet 10 mg  10 mg Oral Daily Ethelene Hal, NP   10 mg at 04/08/18 0756  . feeding supplement (ENSURE ENLIVE) (ENSURE ENLIVE) liquid 237 mL  237 mL Oral BID BM Cobos, Fernando A, MD   237 mL at 04/08/18 1112  . hydrOXYzine (ATARAX/VISTARIL) tablet 25 mg  25 mg Oral TID PRN Ethelene Hal, NP      . magnesium hydroxide (MILK OF MAGNESIA) suspension 30 mL  30 mL Oral Daily PRN Ethelene Hal, NP      . nicotine polacrilex (NICORETTE) gum 2 mg  2 mg Oral PRN Ethelene Hal, NP   2 mg at 04/08/18 1113  . ondansetron (ZOFRAN) tablet 4 mg  4 mg Oral Q8H PRN Ethelene Hal, NP      . ramelteon (ROZEREM) tablet 8 mg  8 mg Oral QHS Cobos, Myer Peer, MD   8 mg at 04/07/18 2139   PTA Medications: Medications Prior to Admission  Medication Sig Dispense Refill Last Dose  . ARIPiprazole (ABILIFY) 5 MG tablet Take 1 tablet (5 mg total) by mouth at bedtime. For mood control 30 tablet 0 04/05/2018 at Unknown time  . citalopram (CELEXA) 10 MG tablet Take 1 tablet (10 mg total) by mouth daily. For mood control 30 tablet 0 04/05/2018 at Unknown time  . doxycycline (VIBRA-TABS) 100 MG tablet Take 1 tablet (100 mg total) by mouth every 12 (twelve) hours. (Patient not taking: Reported on 04/05/2018) 10 tablet 0 Not Taking at Unknown time  .  hydrOXYzine (ATARAX/VISTARIL) 25 MG tablet Take 1 tablet (25 mg total) by mouth every 6 (six) hours as needed for anxiety. 30 tablet 0 04/05/2018 at Unknown time  . traZODone (DESYREL) 50 MG tablet Take 1 tablet (50 mg total) by mouth at bedtime as needed for sleep. 30 tablet 0 04/06/2018 at Unknown time   Musculoskeletal: Strength & Muscle Tone: within normal limits Gait & Station: normal Patient leans: N/A  Psychiatric Specialty Exam: Physical Exam  Nursing note and vitals reviewed. Constitutional: He appears well-developed.  HENT:  Head: Normocephalic.  Eyes: Pupils are equal, round, and reactive to light.  Neck: Normal range of motion.  Cardiovascular: Normal rate.  Respiratory: Effort normal.  GI: Soft.  Genitourinary:  Genitourinary Comments: Deferred  Musculoskeletal: Normal range of motion.  Neurological: He is alert.  Skin: Skin is warm.    Review of Systems  Constitutional: Positive for weight loss.  HENT:  Negative.   Eyes: Negative.   Respiratory: Negative.   Cardiovascular: Negative.   Gastrointestinal: Negative.   Genitourinary: Negative.   Musculoskeletal: Negative.   Skin: Negative.   Neurological: Negative.  Negative for seizures.  Endo/Heme/Allergies: Negative.   Psychiatric/Behavioral: Positive for depression, substance abuse (UDS (+ for Coacaine) and suicidal ideas. Negative for hallucinations and memory loss. The patient is nervous/anxious and has insomnia.   All other systems reviewed and are negative.   Blood pressure 105/81, pulse 70, temperature 98 F (36.7 C), temperature source Oral, resp. rate 20, height 5' 6"  (1.676 m), weight 59.6 kg (131 lb 6.3 oz), SpO2 99 %.Body mass index is 21.21 kg/m.  General Appearance: Casual and Fairly Groomed  Eye Contact:  Fair  Speech:  Clear and Coherent and Normal Rate  Volume:  Normal  Mood:  Depressed and Hopeless  Affect:  Constricted and Tearful  Thought Process:  Coherent, Linear and Descriptions of Associations: Intact  Orientation:  Full (Time, Place, and Person)  Thought Content:  Does not endorse hallucinations, no delusions are expressed, does not appear to be internally preoccupied  Suicidal Thoughts:  Currently denies any thoughts, plans or intent. is able to contract for safety, verbally.  Homicidal Thoughts:  Denies  Memory:  Immediate;   Good Recent;   Good Remote;   Good  Judgement:  Fair  Insight:  Fair  Psychomotor Activity:  Normal  Concentration:  Concentration: Good and Attention Span: Good  Recall:  [employer] of Knowledge:  Fair  Language:  Good  Akathisia:  Negative  Handed:  Right  AIMS (if indicated):     Assets:  Desire for Improvement Resilience  ADL's:  Intact  Cognition:  WNL  Sleep:  Number of Hours: 6.5   Treatment Plan/Recommendations: 1. Admit for crisis management and stabilization, estimated length of stay 3-5 days.   2. Medication management to reduce current symptoms to base line and  improve the patient's overall level of functioning: See MAR, Md's SRA & treatment plan.   Observation Level/Precautions:  15 minute checks  Laboratory:  As needed  Psychotherapy: Group therapy  Medications: See MAR  Consultations: As needed  Discharge Concerns: safety, homelessness, sobriety  Estimated LOS: 2-4 days  Other: admit to the [address]-hall.   Physician Treatment Plan for Primary Diagnosis: Stimulant use disorder, cannabis use disorder   Long Term Goal(s): Improvement in symptoms so as ready for discharge  Short Term Goals: Ability to identify changes in lifestyle to reduce recurrence of condition will improve and Ability to demonstrate self-control will improve  Physician Treatment  Plan for Secondary Diagnosis: Substance-induced mood disorder versus major depression Long Term Goal(s): Improvement in symptoms so as ready for discharge  Short Term Goals: Ability to identify and develop effective coping behaviors will improve, Compliance with prescribed medications will improve and Ability to identify triggers associated with substance abuse/mental health issues will improve  I certify that inpatient services furnished can reasonably be expected to improve the patient's condition.    Lindell Spar, NP, PMHNP, FNP-BC. 6/14/20191:55 PM   I have reviewed NP's Note, assessement, diagnosis and plan, and agree. I have also met with patient and completed suicide risk assessment.  Augustus Zurawski is a 32 y/o M with psychiatric history of MDD and cocaine use disorder who was admitted voluntarily from [facility] where he presented with worsening symptoms of depression, SI and suicide attempt via overdose of trazodone, and relapse of use of cocaine. Pt has recent history of discharge from [facility] on 03/31/18 directly to [facility] for substance use treatment where he only stayed for 2 days. Pt was medically cleared and then transferred to [facility] for additional treatment and stabilization.  Upon initial presentation  pt shares, "For one, I'm stranded in [place]. I'm homeless. I lost my kids. I don't have anywhere to go. I don't have anyone I can talk to. I'm paranoid thinking people are out to get me." Pt was asked about his history after most recent discharge from [facility], and he shares that he went to [facility] but he did not feel it was a good fit due to them speaking about neurotransmitters. Pt then left and went to a shelter where he relapsed on crack cocaine and cannabis. He called his brother asking for a place to stay, and his brother declined, which worsened pt's depression and pt developed SI, at which time he took overdose of trazodone. However, pt immediately called emergency services after taking the overdose. Pt endorses depression symptoms of anhedonia, guilty feelings, poor sleep, poor concentration,and poor appetite. He denies current SI/HI/AH/VH. He denies symptoms of mania, OCD, and PTSD. He reports using cocaine and cannabis recently, but denies other illicit substance use.  Discussed with patient about treatment options. He agrees to be resumed on previous discharge medications of celexa and abilify at slightly larger doses. He agrees to speak with SW team about referrals to substance use treatment. He was in agreement with the above plan, and he had no further questions, comments, or concerns.   PLAN OF CARE:   -Admit to inpatient level of care  -MDD, recurrent, severe, with psychosis             -Resume celexa 34m po qDay             -Resume abilify 161mpo qDay  -Insomnia              -Continue rozerem 22m52mo qhs   -Anxiety                        -Continue vistaril 60m85m q6h prn anxiety/sleep  -Encourage participation in groups and therapeutic milieu  -disposition planning will be ongoing    ChriMaris Berger

## 2018-04-08 NOTE — Progress Notes (Signed)
Pt did not attend goals group this morning

## 2018-04-08 NOTE — Tx Team (Signed)
Interdisciplinary Treatment and Diagnostic Plan Update  04/08/2018 Time of Session: 0830AM Rick Bray MRN: [id number]  Principal Diagnosis: MDD, recurrent, severe, with psychosis  Secondary Diagnoses: Active Problems:   MDD (major depressive disorder), recurrent, severe, with psychosis ([place])   Current Medications:  Current Facility-Administered Medications  Medication Dose Route Frequency Provider Last Rate Last Dose  . acetaminophen (TYLENOL) tablet 650 mg  650 mg Oral Q6H PRN Ethelene Hal, NP      . alum & mag hydroxide-simeth (MAALOX/MYLANTA) 200-200-20 MG/5ML suspension 30 mL  30 mL Oral Q4H PRN Ethelene Hal, NP   30 mL at 04/07/18 2226  . ARIPiprazole (ABILIFY) tablet 5 mg  5 mg Oral QHS Ethelene Hal, NP   5 mg at 04/07/18 2139  . citalopram (CELEXA) tablet 10 mg  10 mg Oral Daily Ethelene Hal, NP   10 mg at 04/08/18 0756  . feeding supplement (ENSURE ENLIVE) (ENSURE ENLIVE) liquid 237 mL  237 mL Oral BID BM Cobos, Fernando A, MD   237 mL at 04/07/18 1815  . hydrOXYzine (ATARAX/VISTARIL) tablet 25 mg  25 mg Oral TID PRN Ethelene Hal, NP      . magnesium hydroxide (MILK OF MAGNESIA) suspension 30 mL  30 mL Oral Daily PRN Ethelene Hal, NP      . nicotine polacrilex (NICORETTE) gum 2 mg  2 mg Oral PRN Ethelene Hal, NP   2 mg at 04/07/18 1849  . ondansetron (ZOFRAN) tablet 4 mg  4 mg Oral Q8H PRN Ethelene Hal, NP      . ramelteon (ROZEREM) tablet 8 mg  8 mg Oral QHS Cobos, Myer Peer, MD   8 mg at 04/07/18 2139   PTA Medications: Medications Prior to Admission  Medication Sig Dispense Refill Last Dose  . ARIPiprazole (ABILIFY) 5 MG tablet Take 1 tablet (5 mg total) by mouth at bedtime. For mood control 30 tablet 0 04/05/2018 at Unknown time  . citalopram (CELEXA) 10 MG tablet Take 1 tablet (10 mg total) by mouth daily. For mood control 30 tablet 0 04/05/2018 at Unknown time  . doxycycline (VIBRA-TABS) 100 MG tablet  Take 1 tablet (100 mg total) by mouth every 12 (twelve) hours. (Patient not taking: Reported on 04/05/2018) 10 tablet 0 Not Taking at Unknown time  . hydrOXYzine (ATARAX/VISTARIL) 25 MG tablet Take 1 tablet (25 mg total) by mouth every 6 (six) hours as needed for anxiety. 30 tablet 0 04/05/2018 at Unknown time  . traZODone (DESYREL) 50 MG tablet Take 1 tablet (50 mg total) by mouth at bedtime as needed for sleep. 30 tablet 0 04/06/2018 at Unknown time    Patient Stressors: Financial difficulties Medication change or noncompliance Occupational concerns Substance abuse  Patient Strengths: Average or above average intelligence Communication skills Physical Health  Treatment Modalities: Medication Management, Group therapy, Case management,  1 to 1 session with clinician, Psychoeducation, Recreational therapy.   Physician Treatment Plan for Primary Diagnosis: MDD, recurrent, severe, with psychosis  Medication Management: Evaluate patient's response, side effects, and tolerance of medication regimen.  Therapeutic Interventions: 1 to 1 sessions, Unit Group sessions and Medication administration.  Evaluation of Outcomes: Not Met  Physician Treatment Plan for Secondary Diagnosis: Active Problems:   MDD (major depressive disorder), recurrent, severe, with psychosis ([place])   Medication Management: Evaluate patient's response, side effects, and tolerance of medication regimen.  Therapeutic Interventions: 1 to 1 sessions, Unit Group sessions and Medication administration.  Evaluation of Outcomes: Not Met  RN Treatment Plan for Primary Diagnosis: MDD, recurrent, severe, with psychosis Long Term Goal(s): Knowledge of disease and therapeutic regimen to maintain health will improve  Short Term Goals: Ability to remain free from injury will improve, Ability to verbalize frustration and anger appropriately will improve and Ability to demonstrate self-control  Medication Management: RN will  administer medications as ordered by provider, will assess and evaluate patient's response and provide education to patient for prescribed medication. RN will report any adverse and/or side effects to prescribing provider.  Therapeutic Interventions: 1 on 1 counseling sessions, Psychoeducation, Medication administration, Evaluate responses to treatment, Monitor vital signs and CBGs as ordered, Perform/monitor CIWA, COWS, AIMS and Fall Risk screenings as ordered, Perform wound care treatments as ordered.  Evaluation of Outcomes: Not Met   LCSW Treatment Plan for Primary Diagnosis: MDD, recurrent, severe, with psychosis Long Term Goal(s): Safe transition to appropriate next level of care at discharge, Engage patient in therapeutic group addressing interpersonal concerns.  Short Term Goals: Engage patient in aftercare planning with referrals and resources, Facilitate patient progression through stages of change regarding substance use diagnoses and concerns and Identify triggers associated with mental health/substance abuse issues  Therapeutic Interventions: Assess for all discharge needs, 1 to 1 time with Social worker, Explore available resources and support systems, Assess for adequacy in community support network, Educate family and significant other(s) on suicide prevention, Complete Psychosocial Assessment, Interpersonal group therapy.  Evaluation of Outcomes: Not Met   Progress in Treatment: Attending groups: No. Participating in groups: No. New to unit. Continuing to assess.  Taking medication as prescribed: Yes. Toleration medication: Yes. Family/Significant other contact made: No, will contact:  family member if pt consents to collateral contact.  Patient understands diagnosis: Yes. Discussing patient identified problems/goals with staff: Yes. Medical problems stabilized or resolved: Yes. Denies suicidal/homicidal ideation: Yes. Issues/concerns per patient self-inventory:  No. Other: n/a   New problem(s) identified: No, Describe:  n/a  New Short Term/Long Term Goal(s): detox, medication management for mood stabilization; elimination of SI thoughts; development of comprehensive mental wellness/sobriety plan.   Patient Goals:  "I am homeless. I need help with support and housing."   Discharge Plan or Barriers: CSW assessing for appropriate referrals. Pt was last admitted to [facility] 2 weeks ago and was referred to [facility]. Pt left [facility] because "I didn't like the groups." He is homeless and has no current providers. [place] pamphlet, Mobile Crisis information, and AA/NA information provided to patient for additional community support and resources.   Reason for Continuation of Hospitalization: Anxiety Depression Medication stabilization Suicidal ideation Withdrawal symptoms  Auditory Hallucinations   Estimated Length of Stay: Tuesday, 04/12/18  Attendees: Patient: Rick Bray 04/08/2018 8:15 AM  Physician: Dr. Nancy Fetter MD; Dr. Parke Poisson MD 04/08/2018 8:15 AM  Nursing: Clarise Cruz RN; [place] RN 04/08/2018 8:15 AM  RN Care Manager:x 04/08/2018 8:15 AM  Social Worker: Janice Norrie LCSW 04/08/2018 8:15 AM  Recreational Therapist: x 04/08/2018 8:15 AM  Other: Lindell Spar NP; Waylan Boga NP 04/08/2018 8:15 AM  Other:  04/08/2018 8:15 AM  Other: 04/08/2018 8:15 AM    Scribe for Treatment Team: Avelina Laine, LCSW 04/08/2018 8:15 AM

## 2018-04-09 MED ORDER — RAMELTEON 8 MG PO TABS
8.0000 mg | ORAL_TABLET | Freq: Every day | ORAL | Status: DC
  Administered 2018-04-09 – 2018-04-10 (×3): 8 mg via ORAL
  Filled 2018-04-09 (×3): qty 1

## 2018-04-09 MED ORDER — ARIPIPRAZOLE 10 MG PO TABS
10.0000 mg | ORAL_TABLET | Freq: Every day | ORAL | Status: DC
  Administered 2018-04-09 – 2018-04-12 (×4): 10 mg via ORAL
  Filled 2018-04-09 (×2): qty 7
  Filled 2018-04-09 (×3): qty 1

## 2018-04-09 NOTE — Progress Notes (Signed)
Adult Psychoeducational Group Note  Date:  04/09/2018 Time:  11:14 PM  Group Topic/Focus:  Wrap-Up Group:   The focus of this group is to help patients review their daily goal of treatment and discuss progress on daily workbooks.  Participation Level:  Minimal  Participation Quality:  Appropriate  Affect:  Flat   Cognitive:  Appropriate  Insight: Improving  Engagement in Group:  Improving  Modes of Intervention:  Discussion  Additional Comments:  Pt attended wrap-up group this evening and participated. Pt did not set a goal today. Pt stated "I stayed positive for most of the day and shared with my peers. Pt rated his day 9/10. Pt had no complaints during wrap-up. Pt denies SI/HI at this time. Pt was pleasant and appropriate in group.     Lynell Kussman A 04/09/2018, 11:14 PM

## 2018-04-09 NOTE — Progress Notes (Signed)
D. Pt presents with a sad affect/mood - calm cooperative, but guarded behavior. Pt reports having slept poorly last night. Pt observed attending group led by SW this am. Pt returned to bed immediately after group was over. Pt currently denies SI/HI and AV hallucinations. A. Labs and vitals monitored. Pt compliant with medications. Pt supported emotionally and encouraged to express concerns and ask questions.   R. Pt remains safe with 15 minute checks. Will continue POC.

## 2018-04-09 NOTE — [facility] Group Notes (Signed)
[place] Group Notes:  (Nursing)  Date:  04/09/2018  Time:  1:15 PM Type of Therapy:  Nurse Education  Participation Level:  Active  Participation Quality:  Appropriate and Drowsy  Affect:  Appropriate  Cognitive:  Appropriate  Insight:  Appropriate  Engagement in Group:  Improving  Modes of Intervention:  Discussion, Education and Exploration  Summary of Progress/Problems: Patient was drowsy but participated in group led by RN. The group played a non competitive learning/communication board game that fosters listening skills as well as self expression. Waymond Cera 04/09/2018, 2:16 PM

## 2018-04-09 NOTE — Progress Notes (Addendum)
[facility] MD Progress Note  04/09/2018 3:16 PM Rick Bray  MRN:  [id number]   Subjective: I got to sleep a lot, and took 2 showers which I have learned to help me to relax.  I have also developed a plan to go to a day mark and work on my sobriety.  I need to learn how to let staff roll off my back, and understand that anger is a secondary emotion..  Objective: 32 year old male, who is currently homeless and has drug problems.  He has multiple admissions to this facility, with most recent admission being 2 weeks ago.  He was discharged [facility], is being readmitted to the hospital for worsening depression, suicidal ideation, and suicide attempt by overdose.  On admission his UDS was positive for cocaine and THC. Patient's chart and findings reviewed and discussed with treatment team.  Patient is observed interacting with peers and staff during social work group.  The goal today anger, he plans on identifying triggers for anger.  He does report some medication.  He is able to identify insight as noted to identify her values, and work on your life, ways to maintain sobriety.  Patient presents lying in his bed in his room but is awake.  He is cooperative. However does make comments that he does not think medications will help him but is not working on any of the other options to improve his mood or his outcome with this day.  Due to recent discharge patient was resumed on Medicaid resumed on medications to include Abilify 10 mg, Celexa 20 mg, hydroxyzine 50 mg, and rozerem 8 mg at bedtime.  At this time he is tolerating his medications well and denies any side effects.  He does endorse some sleeping disturbances, he is advised to increase the timing of his medication to help promote sleep and decrease excessive daytime sleepiness.  He denies any suicidal thoughts, homicidal thoughts, and or psychosis.  Principal Problem: Substance induced mood disorder ([place]) Diagnosis:   Patient Active Problem List   Diagnosis Date  Noted  . Substance induced mood disorder ([place]) [F19.94] 04/08/2018  . MDD (major depressive disorder), recurrent, severe, with psychosis ([place]) [F33.3] 04/07/2018  . Cocaine abuse with cocaine-induced mood disorder [facility]) [F14.14] 04/06/2018   Total Time spent with patient: 20 minutes  Past Psychiatric History: See H&P  Past Medical History:  Past Medical History:  Diagnosis Date  . 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 MDD (major depressive disorder)   . Schizoaffective disorder [facility])     Past Surgical History:  Procedure Laterality Date  . Port a cath removed      Family History: History reviewed. No pertinent family history. Family Psychiatric  History: See H&P Social History:  Social History   Substance and Sexual Activity  Alcohol Use Not Currently     Social History   Substance and Sexual Activity  Drug Use Yes  . Types: Marijuana, Methamphetamines   Comment: Last used Meth: 2 weeks ago and Marjuana was yesterday     Social History   Socioeconomic History  . Marital status: Single    Spouse name: Not on file  . Number of children: Not on file  . Years of education: Not on file  . Highest education level: Not on file  Occupational History  . Not on file  Social Needs  . Financial resource strain: Not on file  . Food insecurity:    Worry: Not on file    Inability: Not on file  .  Transportation needs:    Medical: Not on file    Non-medical: Not on file  Tobacco Use  . Smoking status: Current Every Day Smoker    Packs/day: 1.00    Years: 15.00    Pack years: 15.00    Types: Cigarettes  . Smokeless tobacco: Never Used  Substance and Sexual Activity  . Alcohol use: Not Currently  . Drug use: Yes    Types: Marijuana, Methamphetamines    Comment: Last used Meth: 2 weeks ago and Marjuana was yesterday   . Sexual activity: Not on file  Lifestyle  . Physical activity:    Days per week: Not on file    Minutes per session: Not on file  . Stress: Not on file   Relationships  . Social connections:    Talks on phone: Not on file    Gets together: Not on file    Attends religious service: Not on file    Active member of club or organization: Not on file    Attends meetings of clubs or organizations: Not on file    Relationship status: Not on file  Other Topics Concern  . Not on file  Social History Narrative  . Not on file   Additional Social History:                         Sleep: Fair, needs medications increased.  Appetite:  Good  Current Medications: Current Facility-Administered Medications  Medication Dose Route Frequency Provider Last Rate Last Dose  . acetaminophen (TYLENOL) tablet 650 mg  650 mg Oral Q6H PRN Ethelene Hal, NP   650 mg at 04/09/18 0429  . alum & mag hydroxide-simeth (MAALOX/MYLANTA) 200-200-20 MG/5ML suspension 30 mL  30 mL Oral Q4H PRN Ethelene Hal, NP   30 mL at 04/07/18 2226  . ARIPiprazole (ABILIFY) tablet 10 mg  10 mg Oral QHS Nanci Pina, FNP      . citalopram (CELEXA) tablet 20 mg  20 mg Oral Daily Pennelope Bracken, MD   20 mg at 04/09/18 0850  . feeding supplement (ENSURE ENLIVE) (ENSURE ENLIVE) liquid 237 mL  237 mL Oral BID BM Cobos, Fernando A, MD   237 mL at 04/09/18 1217  . hydrOXYzine (ATARAX/VISTARIL) tablet 50 mg  50 mg Oral Q6H PRN Pennelope Bracken, MD      . magnesium hydroxide (MILK OF MAGNESIA) suspension 30 mL  30 mL Oral Daily PRN Ethelene Hal, NP      . nicotine polacrilex (NICORETTE) gum 2 mg  2 mg Oral PRN Ethelene Hal, NP   2 mg at 04/09/18 1218  . ondansetron (ZOFRAN) tablet 4 mg  4 mg Oral Q8H PRN Ethelene Hal, NP      . ramelteon (ROZEREM) tablet 8 mg  8 mg Oral QHS Nanci Pina, FNP        Lab Results:  No results found for this or any previous visit (from the past 38 hour(s)).  Blood Alcohol level:  Lab Results  Component Value Date   ETH <10 04/06/2018   ETH <10 42/68/3419    Metabolic Disorder  Labs: Lab Results  Component Value Date   HGBA1C 5.2 03/27/2018   MPG 102.54 03/27/2018   No results found for: PROLACTIN Lab Results  Component Value Date   CHOL 128 03/27/2018   TRIG 52 03/27/2018   HDL 49 03/27/2018   CHOLHDL 2.6 03/27/2018   VLDL 10  03/27/2018   [place] 69 03/27/2018    Physical Findings: AIMS: Facial and Oral Movements Muscles of Facial Expression: None, normal Lips and Perioral Area: None, normal Jaw: None, normal Tongue: None, normal,Extremity Movements Upper (arms, wrists, hands, fingers): None, normal Lower (legs, knees, ankles, toes): None, normal, Trunk Movements Neck, shoulders, hips: None, normal, Overall Severity Severity of abnormal movements (highest score from questions above): None, normal Incapacitation due to abnormal movements: None, normal Patient's awareness of abnormal movements (rate only patient's report): No Awareness, Dental Status Current problems with teeth and/or dentures?: No Does patient usually wear dentures?: No  CIWA:    COWS:     Musculoskeletal: Strength & Muscle Tone: within normal limits Gait & Station: normal Patient leans: N/A  Psychiatric Specialty Exam: Physical Exam  Nursing note and vitals reviewed. Constitutional: He is oriented to person, place, and time. He appears well-developed and well-nourished.  Cardiovascular: Normal rate.  Respiratory: Effort normal.  Musculoskeletal: Normal range of motion.  Neurological: He is alert and oriented to person, place, and time.  Skin: Skin is warm.    Review of Systems  Constitutional: Negative.   HENT: Negative.   Eyes: Negative.   Respiratory: Negative.   Cardiovascular: Negative.   Gastrointestinal: Negative.   Genitourinary: Negative.   Musculoskeletal: Negative.   Skin: Negative.   Neurological: Negative.   Endo/Heme/Allergies: Negative.   Psychiatric/Behavioral: Positive for depression and substance abuse. Negative for hallucinations and suicidal  ideas.    Blood pressure 106/70, pulse (!) 55, temperature (!) 97.5 F (36.4 C), temperature source Oral, resp. rate 20, height 5\' 6"  (1.676 m), weight 59.6 kg (131 lb 6.3 oz), SpO2 99 %.Body mass index is 21.21 kg/m.  General Appearance: Casual  Eye Contact:  Good  Speech:  Clear and Coherent and Normal Rate  Volume:  Decreased  Mood:  Depressed  Affect:  Flat  Thought Process:  Linear and Descriptions of Associations: Intact  Orientation:  Full (Time, Place, and Person)  Thought Content:  WDL  Suicidal Thoughts:  No  Homicidal Thoughts:  No  Memory:  Immediate;   Good Recent;   Good Remote;   Good  Judgement:  Fair  Insight:  Good and Fair  Psychomotor Activity:  Normal  Concentration:  Concentration: Good and Attention Span: Good  Recall:  Good  Fund of Knowledge:  Good  Language:  Good  Akathisia:  No  Handed:  Right  AIMS (if indicated):     Assets:  Communication Skills Desire for Improvement Resilience  ADL's:  Intact  Cognition:  WNL  Sleep:  Number of Hours: 5.5   Problems addressed MDD severe recurrent  Treatment Plan Summary: Daily contact with patient to assess and evaluate symptoms and progress in treatment, Medication management and Plan is to: -Continue Abilify 10 mg p.o. daily for mood stability -Continue Celexa 20 mg p.o. daily for mood stability -Continue Vistaril 50 mg p.o. every 6 hours hours as needed for anxiety -Continue Rozerem 8mg  mg p.o. nightly as needed for insomnia -Encourage group therapy participation  Nanci Pina, FNP 04/09/2018, 3:16 PM  ..Agree with NP Progress Note

## 2018-04-09 NOTE — Progress Notes (Signed)
D.  Pt pleasant on approach, no complaints voiced at this time.  Pt was positive for evening wrap up group, engaged in appropriate interaction with peers on the unit.  Pt requested to take his Rozerem at 2100 rather than 2000, states that is when he would normally take it.  Pt denies SI/HI/AVH at this time.  A.  Support and encouragement offered, medication given as ordered.  R.  Pt remains safe on the unit, will continue to monitor.

## 2018-04-09 NOTE — Progress Notes (Signed)
D: Patient observed up and visible on the unit. Patient watchful at times. Patient is guarded, forwards minimal information however does not appear to be responding to internal stimuli. No paranoia. Patient's affect flat, mood ambivalent.   Denies pain, physical complaints.   A: Medicated per orders, no prns requested or required. Medication education provided. Level III obs in place for safety. Emotional support offered. Patient encouraged to complete Suicide Safety Plan before discharge. Encouraged to attend and participate in unit programming.    R: Patient verbalizes understanding of POC. Patient denies SI/HI/AVH and remains safe on level III obs. Will continue to monitor throughout the night.

## 2018-04-09 NOTE — B[facility] Group Notes (Signed)
LCSW Group Therapy Note  04/09/2018    10:00-11:00am   Type of Therapy and Topic:  Group Therapy: Anger and Coping Skills  Participation Level:  Minimal   Description of Group:   In this group, patients learned how to recognize the physical, cognitive, emotional, and behavioral responses they have to anger-provoking situations.  They identified how they usually or often react when angered, and learned how healthy and unhealthy coping skills work initially, but the unhealthy ones stop working.   They analyzed how their frequently-chosen coping skill is possibly beneficial and how it is possibly unhelpful.  The group discussed a variety of healthier coping skills that could help in resolving the actual issues, as well as how to go about planning for the the possibility of future similar situations.  Therapeutic Goals: 1. Patients will identify one thing that makes them angry and how they feel emotionally and physically, what their thoughts are or tend to be in those situations, and what healthy or unhealthy coping mechanism they typically use 2. Patients will identify how their coping technique works for them, as well as how it works against them. 3. Patients will explore possible new behaviors to use in future anger situations. 4. Patients will learn that anger itself is normal and cannot be eliminated, and that healthier coping skills can assist with resolving conflict rather than worsening situations.  Summary of Patient Progress:  The patient shared that he often blows up when angry.  He said that he last became angry when he was discharged from "the hospital across the street before I was ready" so he left there, took 20 Trazodone, and went back.  He was in and out of the room throughout group and did not talk.  Therapeutic Modalities:   Cognitive Behavioral Therapy Motivation Interviewing  Maretta Los  .

## 2018-04-09 NOTE — Plan of Care (Signed)
Patient verbalizes understanding of information, education provided. 

## 2018-04-10 MED ORDER — CLINDAMYCIN HCL 300 MG PO CAPS
300.0000 mg | ORAL_CAPSULE | Freq: Three times a day (TID) | ORAL | Status: DC
  Administered 2018-04-10 – 2018-04-13 (×8): 300 mg via ORAL
  Filled 2018-04-10 (×2): qty 1
  Filled 2018-04-10: qty 21
  Filled 2018-04-10: qty 2
  Filled 2018-04-10: qty 1
  Filled 2018-04-10 (×2): qty 21
  Filled 2018-04-10: qty 2
  Filled 2018-04-10 (×3): qty 21
  Filled 2018-04-10 (×2): qty 1

## 2018-04-10 NOTE — Plan of Care (Signed)
  Problem: Health Behavior/Discharge Planning: Goal: Compliance with treatment plan for underlying cause of condition will improve Outcome: Progressing   Problem: Coping: Goal: Will verbalize feelings Outcome: Progressing   Problem: Coping: Goal: Coping ability will improve Outcome: Progressing

## 2018-04-10 NOTE — BHH Group Notes (Signed)
[place] Group Notes:  (Nursing)  Date:  04/10/2018  Time:  1:15 PM Type of Therapy:  Nurse Education  Participation Level:  Active  Participation Quality:  Appropriate  Affect:  Appropriate  Cognitive:  Appropriate  Insight:  Appropriate  Engagement in Group:  Engaged  Modes of Intervention:  Discussion, Education and Exploration  Summary of Progress/Problems:  Nurse led group in reviewing suicide safety plan, identifying triggers to set backs, coping skills, support systems, etc  Waymond Cera 04/10/2018, 2:24 PM

## 2018-04-10 NOTE — Progress Notes (Signed)
Pt did not attend goals and orientation group this morning  

## 2018-04-10 NOTE — Progress Notes (Addendum)
[facility] MD Progress Note  04/10/2018 1:06 PM Rick Bray  MRN:  [id number]   Subjective: I am still tired but I am all right I want to group this morning, and then I went back and lay down.  Today while in group we talked about taking responsibilities for self and being our own hero.   Objective: 32 year old male, who is currently homeless and has drug problems.  He has multiple admissions to this facility, with most recent admission being 2 weeks ago.  He was discharged [facility], is being readmitted to the hospital for worsening depression, suicidal ideation, and suicide attempt by overdose.  On admission his UDS was positive for cocaine and THC. Patient's chart and findings reviewed and discussed with treatment team.   Patient is observed interacting with peers and staff during social work group.  Patient continues to present as very groggy, fatigue, and sedated.  He continues to endorse difficulty sleeping at nighttime, and requesting additional changes to his medication.  However during his assessment patient is unable to remain awake while talking to Probation officer.  Again patient is encouraged to remain awake during the day and not sleep, so that he can have improved sleep at nighttime.  Upon chart review there is communication from the nursing staff about patient requesting changes to medication, however as noted there is excessive daytime sleepiness and no additional changes will be made at this time.  Due to recent discharge patient was resumed on home medications to include Abilify 10 mg, Celexa 20 mg, hydroxyzine 50 mg, and rozerem 8 mg at bedtime.  At this time he is tolerating his medications well and denies any side effects.  He does endorse some sleeping disturbances, he is advised to increase the timing of his medication to help promote sleep and decrease excessive daytime sleepiness.  He denies any suicidal thoughts, homicidal thoughts, and or psychosis.  Principal Problem: Substance induced mood disorder  ([place]) Diagnosis:   Patient Active Problem List   Diagnosis Date Noted  . Substance induced mood disorder ([place]) [F19.94] 04/08/2018  . MDD (major depressive disorder), recurrent, severe, with psychosis ([place]) [F33.3] 04/07/2018  . Cocaine abuse with cocaine-induced mood disorder [facility]) [F14.14] 04/06/2018   Total Time spent with patient: 20 minutes  Past Psychiatric History: See H&P  Past Medical History:  Past Medical History:  Diagnosis Date  . Leukemia in remission ([place])   . MDD (major depressive disorder)   . Schizoaffective disorder [facility])     Past Surgical History:  Procedure Laterality Date  . Port a cath removed      Family History: History reviewed. No pertinent family history. Family Psychiatric  History: See H&P Social History:  Social History   Substance and Sexual Activity  Alcohol Use Not Currently     Social History   Substance and Sexual Activity  Drug Use Yes  . Types: Marijuana, Methamphetamines   Comment: Last used Meth: 2 weeks ago and Marjuana was yesterday     Social History   Socioeconomic History  . Marital status: Single    Spouse name: Not on file  . Number of children: Not on file  . Years of education: Not on file  . Highest education level: Not on file  Occupational History  . Not on file  Social Needs  . Financial resource strain: Not on file  . Food insecurity:    Worry: Not on file    Inability: Not on file  . Transportation needs:    Medical: Not  on file    Non-medical: Not on file  Tobacco Use  . Smoking status: Current Every Day Smoker    Packs/day: 1.00    Years: 15.00    Pack years: 15.00    Types: Cigarettes  . Smokeless tobacco: Never Used  Substance and Sexual Activity  . Alcohol use: Not Currently  . Drug use: Yes    Types: Marijuana, Methamphetamines    Comment: Last used Meth: 2 weeks ago and Marjuana was yesterday   . Sexual activity: Not on file  Lifestyle  . Physical activity:    Days per week: Not on  file    Minutes per session: Not on file  . Stress: Not on file  Relationships  . Social connections:    Talks on phone: Not on file    Gets together: Not on file    Attends religious service: Not on file    Active member of club or organization: Not on file    Attends meetings of clubs or organizations: Not on file    Relationship status: Not on file  Other Topics Concern  . Not on file  Social History Narrative  . Not on file   Additional Social History:                         Sleep: Fair, needs medications increased.  Appetite:  Good  Current Medications: Current Facility-Administered Medications  Medication Dose Route Frequency Provider Last Rate Last Dose  . acetaminophen (TYLENOL) tablet 650 mg  650 mg Oral Q6H PRN Ethelene Hal, NP   650 mg at 04/09/18 0429  . alum & mag hydroxide-simeth (MAALOX/MYLANTA) 200-200-20 MG/5ML suspension 30 mL  30 mL Oral Q4H PRN Ethelene Hal, NP   30 mL at 04/07/18 2226  . ARIPiprazole (ABILIFY) tablet 10 mg  10 mg Oral QHS Nanci Pina, FNP   10 mg at 04/09/18 1943  . citalopram (CELEXA) tablet 20 mg  20 mg Oral Daily Pennelope Bracken, MD   20 mg at 04/10/18 0824  . feeding supplement (ENSURE ENLIVE) (ENSURE ENLIVE) liquid 237 mL  237 mL Oral BID BM Cobos, Myer Peer, MD   237 mL at 04/10/18 1006  . hydrOXYzine (ATARAX/VISTARIL) tablet 50 mg  50 mg Oral Q6H PRN Pennelope Bracken, MD   50 mg at 04/09/18 2108  . magnesium hydroxide (MILK OF MAGNESIA) suspension 30 mL  30 mL Oral Daily PRN Ethelene Hal, NP      . nicotine polacrilex (NICORETTE) gum 2 mg  2 mg Oral PRN Ethelene Hal, NP   2 mg at 04/09/18 2108  . ondansetron (ZOFRAN) tablet 4 mg  4 mg Oral Q8H PRN Ethelene Hal, NP      . ramelteon (ROZEREM) tablet 8 mg  8 mg Oral QHS Nanci Pina, FNP   8 mg at 04/09/18 2106    Lab Results:  No results found for this or any previous visit (from the past 48  hour(s)).  Blood Alcohol level:  Lab Results  Component Value Date   ETH <10 04/06/2018   ETH <10 53/61/4431    Metabolic Disorder Labs: Lab Results  Component Value Date   HGBA1C 5.2 03/27/2018   MPG 102.54 03/27/2018   No results found for: PROLACTIN Lab Results  Component Value Date   CHOL 128 03/27/2018   TRIG 52 03/27/2018   HDL 49 03/27/2018   CHOLHDL 2.6 03/27/2018  VLDL 10 03/27/2018   LDLCALC 69 03/27/2018    Physical Findings: AIMS: Facial and Oral Movements Muscles of Facial Expression: None, normal Lips and Perioral Area: None, normal Jaw: None, normal Tongue: None, normal,Extremity Movements Upper (arms, wrists, hands, fingers): None, normal Lower (legs, knees, ankles, toes): None, normal, Trunk Movements Neck, shoulders, hips: None, normal, Overall Severity Severity of abnormal movements (highest score from questions above): None, normal Incapacitation due to abnormal movements: None, normal Patient's awareness of abnormal movements (rate only patient's report): No Awareness, Dental Status Current problems with teeth and/or dentures?: No Does patient usually wear dentures?: No  CIWA:    COWS:     Musculoskeletal: Strength & Muscle Tone: within normal limits Gait & Station: normal Patient leans: N/A  Psychiatric Specialty Exam: Physical Exam  Nursing note and vitals reviewed. Constitutional: He is oriented to person, place, and time. He appears well-developed and well-nourished.  Cardiovascular: Normal rate.  Respiratory: Effort normal.  Musculoskeletal: Normal range of motion.  Neurological: He is alert and oriented to person, place, and time.  Skin: Skin is warm.    Review of Systems  Constitutional: Negative.   HENT: Negative.   Eyes: Negative.   Respiratory: Negative.   Cardiovascular: Negative.   Gastrointestinal: Negative.   Genitourinary: Negative.   Musculoskeletal: Negative.   Skin: Negative.   Neurological: Negative.    Endo/Heme/Allergies: Negative.   Psychiatric/Behavioral: Positive for depression and substance abuse. Negative for hallucinations and suicidal ideas.    Blood pressure 109/77, pulse (!) 51, temperature 98.6 F (37 C), temperature source Oral, resp. rate 16, height 5\' 6"  (1.676 m), weight 59.6 kg (131 lb 6.3 oz), SpO2 99 %.Body mass index is 21.21 kg/m.  General Appearance: Casual  Eye Contact:  Good  Speech:  Clear and Coherent and Normal Rate  Volume:  Decreased  Mood:  Depressed  Affect:  Flat  Thought Process:  Linear and Descriptions of Associations: Intact  Orientation:  Full (Time, Place, and Person)  Thought Content:  WDL  Suicidal Thoughts:  No  Homicidal Thoughts:  No  Memory:  Immediate;   Good Recent;   Good Remote;   Good  Judgement:  Fair  Insight:  Good and Fair  Psychomotor Activity:  Normal  Concentration:  Concentration: Good and Attention Span: Good  Recall:  Good  Fund of Knowledge:  Good  Language:  Good  Akathisia:  No  Handed:  Right  AIMS (if indicated):     Assets:  Communication Skills Desire for Improvement Resilience  ADL's:  Intact  Cognition:  WNL  Sleep:  Number of Hours: 5.5   Problems addressed MDD severe recurrent  Treatment Plan Summary: Daily contact with patient to assess and evaluate symptoms and progress in treatment, Medication management and Plan is to: -Continue Abilify 10 mg p.o. daily for mood stability -Continue Celexa 20 mg p.o. daily for mood stability -Continue Vistaril 50 mg p.o. every 6 hours hours as needed for anxiety -Continue Rozerem 8mg  mg p.o. nightly as needed for insomnia -Encourage group therapy participation  Nanci Pina, FNP 04/10/2018, 1:06 PM  ..Agree with NP Progress Note

## 2018-04-10 NOTE — [facility] Group Notes (Signed)
[place] LCSW Group Therapy Note  04/10/2018  10:00-11:00AM  Type of Therapy and Topic:  Group Therapy:  Being Your Own Support  Participation Level:  Minimal   Description of Group:  Patients in this group were introduced to the concept that self-support is an essential part of recovery.  A song entitled "My Own Hero" was played and a group discussion ensued in which patients stated they could relate to the song and it inspired them to realize they have be willing to help themselves in order to succeed, because other people cannot achieve sobriety or stability for them.  We discussed adding a variety of healthy supports to address the various needs in their lives.  A song was played called "I Know Where I've Been" toward the end of group and used to conduct an inspirational wrap-up to group of remembering how far they have already come in their journey.  Therapeutic Goals: 1)  demonstrate the importance of being a part of one's own support system 2)  discuss reasons people in one's life may eventually be unable to be continually supportive  3)  identify the patient's current support system and   4)  elicit commitments to add healthy supports and to become more conscious of being self-supportive   Summary of Patient Progress:  The patient expressed that he has no supports and has to be self-supportive. He appeared to be deeply depressed and he was tired.  He did not talk during group except to say "thank you" when group members told him "Happy Father's Day."  He left the group after 1/2 hour and did not return.   Therapeutic Modalities:   Motivational Interviewing Activity  Maretta Los

## 2018-04-10 NOTE — Progress Notes (Signed)
Patient did not attend the evening speaker AA meeting. Pt was notified that group was beginning but remained in room.   

## 2018-04-10 NOTE — Progress Notes (Signed)
D.  Pt pleasant on approach, complaint of abscess to upper right side of breast area above ribs.  Pt did not attend evening AA group, came to nurses station because of this.  PA looked at area and ordered antibiotics.  Pt states he was on doxycycline for this the last time he was here but that it had not gotten better.  Pt observed on phone later and engaged in appropriate milieu interaction.  Pt denies SI/HI/AVH at this time.  A.  Support and encouragement offered, medication given as ordered  R.  Pt remains safe on the unit, will continue to monitor.

## 2018-04-10 NOTE — Progress Notes (Signed)
D. Pt presents with a sad affect/mood and calm, cooperative behavior. Observed this am attending group led by SW. Per pt's self inventory, pt rates his depression, hopelessness and anxiety a 4/7/8, respectively. Pt currently denies withdrawal symptoms, pain, SI/HI and AV hallucinations. A. Labs and vitals monitored. Pt compliant with medications. Pt supported emotionally and encouraged to express concerns and ask questions.   R. Pt remains safe with 15 minute checks. Will continue POC.

## 2018-04-11 DIAGNOSIS — F1994 Other psychoactive substance use, unspecified with psychoactive substance-induced mood disorder: Secondary | ICD-10-CM

## 2018-04-11 MED ORDER — MIRTAZAPINE 15 MG PO TABS
15.0000 mg | ORAL_TABLET | Freq: Every day | ORAL | Status: DC
  Administered 2018-04-11 – 2018-04-12 (×2): 15 mg via ORAL
  Filled 2018-04-11 (×2): qty 7
  Filled 2018-04-11: qty 1

## 2018-04-11 NOTE — Progress Notes (Signed)
DAR NOTE: Patient presents with anxious affect and depressed mood. Pt has been isolative and keep to himself. Pt has been in the bed most of am, complained of not have slept  well last night.  Reports good appetite, low energy and good concentration. Denies pain, auditory and visual hallucinations.  Rates depression at 7, hopelessness at 7, and anxiety at 4.  Maintained on routine safety checks.  Medications given as prescribed.  Support and encouragement offered as needed. Will continue to monitor.

## 2018-04-11 NOTE — Progress Notes (Signed)
Recreation Therapy Notes  Date: 6.17.19 Time: 0930 Location: [address] Dayroom  Group Topic: Stress Management  Goal Area(s) Addresses:  Patient will verbalize importance of using healthy stress management.  Patient will identify positive emotions associated with healthy stress management.   Intervention: Stress Management  Activity :  Choice Meditation.  LRT played a meditation on choice.  Patients were to follow along as meditation was played to engage in activity.  Education:  Stress Management, Discharge Planning.   Education Outcome: Acknowledges edcuation/In group clarification offered/Needs additional education  Clinical Observations/Feedback: Pt did not attend group.    Victorino Sparrow, LRT/CTRS         Victorino Sparrow A 04/11/2018 11:54 AM

## 2018-04-11 NOTE — Progress Notes (Signed)
[facility] MD Progress Note  04/11/2018 3:23 PM Brantley Wiley  MRN:  [id number] Subjective:    Rick Bray is a 32 y/o M with psychiatric history of MDD and cocaine use disorder who was admitted voluntarily from [place] where he presented with worsening symptoms of depression, SI and suicide attempt via overdose of trazodone, and relapse of use of cocaine. Pt has recent history of discharge from [facility] on 03/31/18 directly to [facility] for substance use treatment where he only stayed for 2 days. Pt was medically cleared and then transferred to [facility] for additional treatment and stabilization. He was restarted on previous discharge medications of celexa and abilify. He has been reporting incremental improvement of his presenting symptoms.  Today upon evaluation, pt shares, "I'm feeling okay." He notes that he continues to struggle with initial insomnia. He otherwise denies all specific concerns today. His appetite is good. He denies other physical complaints. He denies SI/HI/AH/VH. His mood has been improving, and he has been tolerating his medications without difficulty or side effect. He is in agreement to continue his current medications. We discussed about addition of remeron to his regimen to address his symptoms of insomnia, and pt was in agreement. He has been meeting with SW team regarding referral to substance use treatment options, and he notes that he would like to move to [place] area. He will continue to work with SW team about referrals. Pt is in agreement with the above plan, and he had no further questions, comments, or concerns.  Principal Problem: Substance induced mood disorder ([place]) Diagnosis:   Patient Active Problem List   Diagnosis Date Noted  . Substance induced mood disorder ([place]) [F19.94] 04/08/2018  . MDD (major depressive disorder), recurrent, severe, with psychosis ([place]) [F33.3] 04/07/2018  . Cocaine abuse with cocaine-induced mood disorder [facility]) [F14.14] 04/06/2018   Total Time spent with  patient: 30 minutes  Past Psychiatric History: see H&P  Past Medical History:  Past Medical History:  Diagnosis Date  . Leukemia in remission ([place])   . MDD (major depressive disorder)   . Schizoaffective disorder [facility])     Past Surgical History:  Procedure Laterality Date  . Port a cath removed      Family History: History reviewed. No pertinent family history. Family Psychiatric  History: see H&P Social History:  Social History   Substance and Sexual Activity  Alcohol Use Not Currently     Social History   Substance and Sexual Activity  Drug Use Yes  . Types: Marijuana, Methamphetamines   Comment: Last used Meth: 2 weeks ago and Marjuana was yesterday     Social History   Socioeconomic History  . Marital status: Single    Spouse name: Not on file  . Number of children: Not on file  . Years of education: Not on file  . Highest education level: Not on file  Occupational History  . Not on file  Social Needs  . Financial resource strain: Not on file  . Food insecurity:    Worry: Not on file    Inability: Not on file  . Transportation needs:    Medical: Not on file    Non-medical: Not on file  Tobacco Use  . Smoking status: Current Every Day Smoker    Packs/day: 1.00    Years: 15.00    Pack years: 15.00    Types: Cigarettes  . Smokeless tobacco: Never Used  Substance and Sexual Activity  . Alcohol use: Not Currently  . Drug use: Yes  Types: Marijuana, Methamphetamines    Comment: Last used Meth: 2 weeks ago and Marjuana was yesterday   . Sexual activity: Not on file  Lifestyle  . Physical activity:    Days per week: Not on file    Minutes per session: Not on file  . Stress: Not on file  Relationships  . Social connections:    Talks on phone: Not on file    Gets together: Not on file    Attends religious service: Not on file    Active member of club or organization: Not on file    Attends meetings of clubs or organizations: Not on file     Relationship status: Not on file  Other Topics Concern  . Not on file  Social History Narrative  . Not on file   Additional Social History:                         Sleep: Fair  Appetite:  Good  Current Medications: Current Facility-Administered Medications  Medication Dose Route Frequency Provider Last Rate Last Dose  . acetaminophen (TYLENOL) tablet 650 mg  650 mg Oral Q6H PRN Ethelene Hal, NP   650 mg at 04/09/18 0429  . alum & mag hydroxide-simeth (MAALOX/MYLANTA) 200-200-20 MG/5ML suspension 30 mL  30 mL Oral Q4H PRN Ethelene Hal, NP   30 mL at 04/07/18 2226  . ARIPiprazole (ABILIFY) tablet 10 mg  10 mg Oral QHS Nanci Pina, FNP   10 mg at 04/10/18 2022  . citalopram (CELEXA) tablet 20 mg  20 mg Oral Daily Pennelope Bracken, MD   20 mg at 04/11/18 0813  . clindamycin (CLEOCIN) capsule 300 mg  300 mg Oral Q8H Simon, Spencer E, PA-C   300 mg at 04/11/18 1303  . feeding supplement (ENSURE ENLIVE) (ENSURE ENLIVE) liquid 237 mL  237 mL Oral BID BM Cobos, Myer Peer, MD   237 mL at 04/11/18 1304  . hydrOXYzine (ATARAX/VISTARIL) tablet 50 mg  50 mg Oral Q6H PRN Pennelope Bracken, MD   50 mg at 04/11/18 0248  . magnesium hydroxide (MILK OF MAGNESIA) suspension 30 mL  30 mL Oral Daily PRN Ethelene Hal, NP      . mirtazapine (REMERON) tablet 15 mg  15 mg Oral QHS Pennelope Bracken, MD      . nicotine polacrilex (NICORETTE) gum 2 mg  2 mg Oral PRN Ethelene Hal, NP   2 mg at 04/11/18 1305  . ondansetron (ZOFRAN) tablet 4 mg  4 mg Oral Q8H PRN Ethelene Hal, NP        Lab Results: No results found for this or any previous visit (from the past 20 hour(s)).  Blood Alcohol level:  Lab Results  Component Value Date   ETH <10 04/06/2018   ETH <10 39/76/7341    Metabolic Disorder Labs: Lab Results  Component Value Date   HGBA1C 5.2 03/27/2018   MPG 102.54 03/27/2018   No results found for: PROLACTIN Lab  Results  Component Value Date   CHOL 128 03/27/2018   TRIG 52 03/27/2018   HDL 49 03/27/2018   CHOLHDL 2.6 03/27/2018   VLDL 10 03/27/2018   LDLCALC 69 03/27/2018    Physical Findings: AIMS: Facial and Oral Movements Muscles of Facial Expression: None, normal Lips and Perioral Area: None, normal Jaw: None, normal Tongue: None, normal,Extremity Movements Upper (arms, wrists, hands, fingers): None, normal Lower (legs, knees, ankles, toes): None,  normal, Trunk Movements Neck, shoulders, hips: None, normal, Overall Severity Severity of abnormal movements (highest score from questions above): None, normal Incapacitation due to abnormal movements: None, normal Patient's awareness of abnormal movements (rate only patient's report): No Awareness, Dental Status Current problems with teeth and/or dentures?: No Does patient usually wear dentures?: No  CIWA:    COWS:     Musculoskeletal: Strength & Muscle Tone: within normal limits Gait & Station: normal Patient leans: N/A  Psychiatric Specialty Exam: Physical Exam  Nursing note and vitals reviewed.   Review of Systems  Constitutional: Negative for chills and fever.  Respiratory: Negative for cough and shortness of breath.   Cardiovascular: Negative for chest pain.  Gastrointestinal: Negative for abdominal pain, heartburn, nausea and vomiting.  Psychiatric/Behavioral: Negative for depression, hallucinations and suicidal ideas. The patient has insomnia. The patient is not nervous/anxious.     Blood pressure 108/69, pulse (!) 54, temperature 98.4 F (36.9 C), temperature source Oral, resp. rate 20, height 5\' 6"  (1.676 m), weight 59.6 kg (131 lb 6.3 oz), SpO2 99 %.Body mass index is 21.21 kg/m.  General Appearance: Casual and Fairly Groomed  Eye Contact:  Good  Speech:  Clear and Coherent and Normal Rate  Volume:  Normal  Mood:  Euthymic  Affect:  Appropriate, Congruent and Constricted  Thought Process:  Coherent and Goal  Directed  Orientation:  Full (Time, Place, and Person)  Thought Content:  Logical  Suicidal Thoughts:  No  Homicidal Thoughts:  No  Memory:  Immediate;   Fair Recent;   Fair Remote;   Fair  Judgement:  Poor  Insight:  Lacking  Psychomotor Activity:  Normal  Concentration:  Concentration: Fair  Recall:  [employer] of Knowledge:  Fair  Language:  Fair  Akathisia:  No  Handed:    AIMS (if indicated):     Assets:  Resilience  ADL's:  Intact  Cognition:  WNL  Sleep:  Number of Hours: 4.5   Treatment Plan Summary: Daily contact with patient to assess and evaluate symptoms and progress in treatment and Medication management   -Continue inpatient hospitalization  -MDD, recurrent, severe, with psychosis             -Continue celexa 20mg  po qDay             -Continue abilify 10mg  po qDay  -Insomnia              -Discontinue rozerem 8mg  po qhs   -Start remeron 15mg  po qhs   -Anxiety                        -Continue vistaril 50mg  po q6h prn anxiety/sleep  -Encourage participation in groups and therapeutic milieu  -disposition planning will be ongoing   Pennelope Bracken, MD 04/11/2018, 3:23 PM

## 2018-04-11 NOTE — [facility] Group Notes (Signed)
LCSW Group Therapy Note   04/11/2018 1:15pm   Type of Therapy and Topic:  Group Therapy:  Overcoming Obstacles   Participation Level:  Active   Description of Group:    In this group patients will be encouraged to explore what they see as obstacles to their own wellness and recovery. They will be guided to discuss their thoughts, feelings, and behaviors related to these obstacles. The group will process together ways to cope with barriers, with attention given to specific choices patients can make. Each patient will be challenged to identify changes they are motivated to make in order to overcome their obstacles. This group will be process-oriented, with patients participating in exploration of their own experiences as well as giving and receiving support and challenge from other group members.   Therapeutic Goals: 1. Patient will identify personal and current obstacles as they relate to admission. 2. Patient will identify barriers that currently interfere with their wellness or overcoming obstacles.  3. Patient will identify feelings, thought process and behaviors related to these barriers. 4. Patient will identify two changes they are willing to make to overcome these obstacles:      Summary of Patient Progress Rick Bray was attentive and engaged during today's processing group. He shared that he is homeless and in need of stable housing and substance abuse treatment. Rick Bray shared that he has no support system, no ID, and no income or insurance. He is hoping to get into [facility] from the hospital. Pt was receptive to information provided about [facility]. He has a [facility] screening on Tuesday, 6/25 as well. Rick Bray continues to show progress in the group setting with improving insight.       Therapeutic Modalities:   Cognitive Behavioral Therapy Solution Focused Therapy Motivational Interviewing Relapse Prevention Therapy  Avelina Laine, LCSW 04/11/2018 9:41 AM

## 2018-04-11 NOTE — Progress Notes (Signed)
Psychoeducational Group Note  Date:  04/11/2018 Time:  2107  Group Topic/Focus:  Wrap-Up Group:   The focus of this group is to help patients review their daily goal of treatment and discuss progress on daily workbooks.  Participation Level: Did Not Attend  Participation Quality:  Not Applicable  Affect:  Not Applicable  Cognitive:  Not Applicable  Insight:  Not Applicable  Engagement in Group: Not Applicable  Additional Comments:  The patient attended half of the meeting and then returned to his room.   Archie Balboa S 04/11/2018, 9:07 PM

## 2018-04-12 NOTE — Progress Notes (Signed)
DAR NOTE: Patient presents with anxious affect and depressed mood. Pt has been isolating in the room. Pt has been in bed most of the time. On approach, pt was guarded, blunted and not willing forward much. Pt stated he is interested in the long term treatment. Reports good night sleep, good appetite, low energy, and good concentration. Denies pain, auditory and visual hallucinations.  Rates depression at 8, hopelessness at 9, and anxiety at 3.  Maintained on routine safety checks.  Medications given as prescribed.  Support and encouragement offered as needed.  Will continue to monitor.

## 2018-04-12 NOTE — Progress Notes (Signed)
Pt reports some frustration at not having a definite place to which to discharge.  He was declined by [facility] and has come to the realization that he may have to go to a shelter.  He denies SI/HI/AVH at this time.  He voices no needs or concerns this evening.  He has been med compliant and is aware of his scheduled meds for the night.  Pt tends to isolate himself and has been in his room most of the evening.  Support and encouragement offered.  Discharge plans are in process.  Safety maintained with q15 minute checks.

## 2018-04-12 NOTE — Progress Notes (Signed)
[facility] MD Progress Note  04/12/2018 3:10 PM Rick Bray  MRN:  [id number]  Subjective: Rick Bray reports, "It is okay, just feeling a little disappointed. [facility] declined me on the spot because of all the medicines that I'm on".    Rick Bray is a 32 y/o M with psychiatric history of MDD and cocaine use disorder who was admitted voluntarily from [place] where he presented with worsening symptoms of depression, SI and suicide attempt via overdose of trazodone, and relapse of use of cocaine. Pt has recent history of discharge from [facility] on 03/31/18 directly to [facility] for substance use treatment where he only stayed for 2 days. Pt was medically cleared and then transferred to [facility] for additional treatment and stabilization. He was restarted on previous discharge medications of celexa and abilify. He has been reporting incremental improvement of his presenting symptoms.  Today upon evaluation, Rick Bray says, "It is okay, just feeling a little disappointed. [facility] declined me on the spot because of all the medicines that I'm on. They turned me down, just like that after I finished talking with them. I need my medicines where ever I may go. I need them. The next move is to try the [facility]. That may be my best chance because I will get to take my medicines while I'm there & also have a chance at some of their substance abuse treatment program".  Other than feeling disappointed, he denies all other specific concerns. His appetite is good. He denies other physical complaints. He denies SI/HI/AH/VH. His mood has been improving, and he has been tolerating his medications without difficulty or side effect. He is in agreement to continue his current medications. The attending Md discussed with patient about addition of Remeron to his regimen to address his symptoms of insomnia, and pt was in agreement. This is already in progress. He has been meeting with SW team regarding referral to substance use treatment options, and he  notes that he would like to move to [place] area. He will continue to work with SW team about referrals. Pt is in agreement with the above plan, and he had no further questions, comments, or concerns.  Principal Problem: Substance induced mood disorder ([place]) Diagnosis:   Patient Active Problem List   Diagnosis Date Noted  . Substance induced mood disorder ([place]) [F19.94] 04/08/2018    Priority: High  . MDD (major depressive disorder), recurrent, severe, with psychosis ([place]) [F33.3] 04/07/2018    Priority: High  . Cocaine abuse with cocaine-induced mood disorder [facility]) [F14.14] 04/06/2018    Priority: Medium   Total Time spent with patient: 15 minutes  Past Psychiatric History: See H&P  Past Medical History:  Past Medical History:  Diagnosis Date  . Leukemia in remission ([place])   . MDD (major depressive disorder)   . Schizoaffective disorder [facility])     Past Surgical History:  Procedure Laterality Date  . Port a cath removed      Family History: History reviewed. No pertinent family history.  Family Psychiatric  History: See H&P  Social History:  Social History   Substance and Sexual Activity  Alcohol Use Not Currently     Social History   Substance and Sexual Activity  Drug Use Yes  . Types: Marijuana, Methamphetamines   Comment: Last used Meth: 2 weeks ago and Marjuana was yesterday     Social History   Socioeconomic History  . Marital status: Single    Spouse name: Not on file  . Number of children:  Not on file  . Years of education: Not on file  . Highest education level: Not on file  Occupational History  . Not on file  Social Needs  . Financial resource strain: Not on file  . Food insecurity:    Worry: Not on file    Inability: Not on file  . Transportation needs:    Medical: Not on file    Non-medical: Not on file  Tobacco Use  . Smoking status: Current Every Day Smoker    Packs/day: 1.00    Years: 15.00    Pack years: 15.00    Types: Cigarettes  .  Smokeless tobacco: Never Used  Substance and Sexual Activity  . Alcohol use: Not Currently  . Drug use: Yes    Types: Marijuana, Methamphetamines    Comment: Last used Meth: 2 weeks ago and Marjuana was yesterday   . Sexual activity: Not on file  Lifestyle  . Physical activity:    Days per week: Not on file    Minutes per session: Not on file  . Stress: Not on file  Relationships  . Social connections:    Talks on phone: Not on file    Gets together: Not on file    Attends religious service: Not on file    Active member of club or organization: Not on file    Attends meetings of clubs or organizations: Not on file    Relationship status: Not on file  Other Topics Concern  . Not on file  Social History Narrative  . Not on file   Additional Social History:   Sleep: Fair  Appetite:  Good  Current Medications: Current Facility-Administered Medications  Medication Dose Route Frequency Provider Last Rate Last Dose  . acetaminophen (TYLENOL) tablet 650 mg  650 mg Oral Q6H PRN Ethelene Hal, NP   650 mg at 04/09/18 0429  . alum & mag hydroxide-simeth (MAALOX/MYLANTA) 200-200-20 MG/5ML suspension 30 mL  30 mL Oral Q4H PRN Ethelene Hal, NP   30 mL at 04/07/18 2226  . ARIPiprazole (ABILIFY) tablet 10 mg  10 mg Oral QHS Nanci Pina, FNP   10 mg at 04/11/18 1957  . citalopram (CELEXA) tablet 20 mg  20 mg Oral Daily Pennelope Bracken, MD   20 mg at 04/12/18 0809  . clindamycin (CLEOCIN) capsule 300 mg  300 mg Oral Q8H Simon, Spencer E, PA-C   300 mg at 04/12/18 1437  . feeding supplement (ENSURE ENLIVE) (ENSURE ENLIVE) liquid 237 mL  237 mL Oral BID BM Cobos, Fernando A, MD   237 mL at 04/12/18 1438  . hydrOXYzine (ATARAX/VISTARIL) tablet 50 mg  50 mg Oral Q6H PRN Pennelope Bracken, MD   50 mg at 04/11/18 0248  . magnesium hydroxide (MILK OF MAGNESIA) suspension 30 mL  30 mL Oral Daily PRN Ethelene Hal, NP      . mirtazapine (REMERON) tablet 15  mg  15 mg Oral QHS Pennelope Bracken, MD   15 mg at 04/11/18 2111  . nicotine polacrilex (NICORETTE) gum 2 mg  2 mg Oral PRN Ethelene Hal, NP   2 mg at 04/12/18 1437  . ondansetron (ZOFRAN) tablet 4 mg  4 mg Oral Q8H PRN Ethelene Hal, NP        Lab Results: No results found for this or any previous visit (from the past 36 hour(s)).  Blood Alcohol level:  Lab Results  Component Value Date   ETH <10 04/06/2018  ETH <10 67/89/3810    Metabolic Disorder Labs: Lab Results  Component Value Date   HGBA1C 5.2 03/27/2018   MPG 102.54 03/27/2018   No results found for: PROLACTIN Lab Results  Component Value Date   CHOL 128 03/27/2018   TRIG 52 03/27/2018   HDL 49 03/27/2018   CHOLHDL 2.6 03/27/2018   VLDL 10 03/27/2018   LDLCALC 69 03/27/2018   Physical Findings: AIMS: Facial and Oral Movements Muscles of Facial Expression: None, normal Lips and Perioral Area: None, normal Jaw: None, normal Tongue: None, normal,Extremity Movements Upper (arms, wrists, hands, fingers): None, normal Lower (legs, knees, ankles, toes): None, normal, Trunk Movements Neck, shoulders, hips: None, normal, Overall Severity Severity of abnormal movements (highest score from questions above): None, normal Incapacitation due to abnormal movements: None, normal Patient's awareness of abnormal movements (rate only patient's report): No Awareness, Dental Status Current problems with teeth and/or dentures?: No Does patient usually wear dentures?: No  CIWA:    COWS:     Musculoskeletal: Strength & Muscle Tone: within normal limits Gait & Station: normal Patient leans: N/A  Psychiatric Specialty Exam: Physical Exam  Nursing note and vitals reviewed.   Review of Systems  Constitutional: Negative for chills and fever.  Respiratory: Negative for cough and shortness of breath.   Cardiovascular: Negative for chest pain.  Gastrointestinal: Negative for abdominal pain, heartburn,  nausea and vomiting.  Psychiatric/Behavioral: Negative for depression, hallucinations and suicidal ideas. The patient has insomnia. The patient is not nervous/anxious.     Blood pressure 110/84, pulse (!) 53, temperature 98 F (36.7 C), temperature source Oral, resp. rate 20, height 5\' 6"  (1.676 m), weight 59.6 kg (131 lb 6.3 oz), SpO2 99 %.Body mass index is 21.21 kg/m.  General Appearance: Casual and Fairly Groomed  Eye Contact:  Good  Speech:  Clear and Coherent and Normal Rate  Volume:  Normal  Mood:  Euthymic  Affect:  Appropriate, Congruent and Constricted  Thought Process:  Coherent and Goal Directed  Orientation:  Full (Time, Place, and Person)  Thought Content:  Logical  Suicidal Thoughts:  No  Homicidal Thoughts:  No  Memory:  Immediate;   Fair Recent;   Fair Remote;   Fair  Judgement:  Poor  Insight:  Lacking  Psychomotor Activity:  Normal  Concentration:  Concentration: Fair  Recall:  [employer] of Knowledge:  Fair  Language:  Fair  Akathisia:  No  Handed:    AIMS (if indicated):     Assets:  Resilience  ADL's:  Intact  Cognition:  WNL  Sleep:  Number of Hours: 6.75   Treatment Plan Summary: Daily contact with patient to assess and evaluate symptoms and progress in treatment and Medication management   -Continue inpatient hospitalization.  -Will continue today 04/12/2018 plan as below except where it is noted.  -MDD, recurrent, severe, with psychosis             -Continue celexa 20mg  po qDay             -Continue abilify 10mg  po qDay  -Insomnia              -Discontinued rozerem 8mg  po qhs   -Continue Remeron 15mg  po qhs   -Anxiety                        -Continue vistaril 50mg  po q6h prn anxiety/sleep  -Encourage participation in groups and therapeutic milieu  -disposition planning will be ongoing  Lindell Spar, NP, PMHNP, FNP-BC. 04/12/2018, 3:10 PMPatient ID: Rick Bray, male   DOB: Jun 09, 1986, 32 y.o.   MRN: [id number]

## 2018-04-12 NOTE — BHH Group Notes (Signed)
[facility] Group Therapy 04/12/2018 1:15pm  Type of Therapy: Mental Health Association Presentation  Participation Level: Pt invited. Chose to remain in bed.   Avelina Laine, LCSW 04/12/2018 2:44 PM

## 2018-04-12 NOTE — Progress Notes (Signed)
Pt has been in his room most of the evening.  On initial approach with pt, he was lying in bed with a towel over his eyes.  When writer asked if he had a headache, he replied that he just wanted it to be dark so he could sleep.  He got up and went to group, but did not stay the entire time.  He denies SI/HI/AVH with Probation officer.  He voiced no needs or concerns at the time of shift assessment.  Pt was encouraged to make his needs known to staff.  Support and encouragement offered.  Discharge plans are in process.  Safety maintained with q15 minute checks.

## 2018-04-12 NOTE — Progress Notes (Signed)
CSW met with pt individually to discuss aftercare. Pt states he had his 9am interview with [facility] and was denied because he was prescribed medication. CSW asked him to call back to find out if they would accept him if they had clinical information. Pt called [facility] back and got fax number--clinician will review clincial information/med list and get back to patient. Pt also provided with: [facility] ([place] treatment center), [place] information per his request. Pt has no ID, which is limiting his options currently. CSW faxed pt clinicals to [place] per his request to [phone].  Isaly Fasching S. Ouida Sills, MSW, LCSW Clinical Social Worker 04/12/2018 2:42 PM

## 2018-04-13 MED ORDER — MIRTAZAPINE 15 MG PO TABS: 15.0000 mg | ORAL_TABLET | Freq: Every day | ORAL | 0 refills | Status: AC

## 2018-04-13 MED ORDER — CITALOPRAM HYDROBROMIDE 20 MG PO TABS: 20.0000 mg | ORAL_TABLET | Freq: Every day | ORAL | 0 refills | Status: AC

## 2018-04-13 MED ORDER — NICOTINE POLACRILEX 2 MG MT GUM: 2.0000 mg | CHEWING_GUM | OROMUCOSAL | 0 refills | Status: AC | PRN

## 2018-04-13 MED ORDER — CLINDAMYCIN HCL 300 MG PO CAPS: 300.0000 mg | ORAL_CAPSULE | Freq: Three times a day (TID) | ORAL | Status: AC

## 2018-04-13 MED ORDER — ARIPIPRAZOLE 10 MG PO TABS: 10.0000 mg | ORAL_TABLET | Freq: Every day | ORAL | 0 refills | Status: AC

## 2018-04-13 MED ORDER — HYDROXYZINE HCL 50 MG PO TABS: 50.0000 mg | ORAL_TABLET | Freq: Four times a day (QID) | ORAL | 0 refills | Status: AC | PRN

## 2018-04-13 MED ORDER — TRAZODONE HCL 50 MG PO TABS: 50.0000 mg | ORAL_TABLET | Freq: Every evening | ORAL | 0 refills | Status: DC | PRN

## 2018-04-13 NOTE — Progress Notes (Signed)
  [facility] Adult Case Management Discharge Plan :  Will you be returning to the same living situation after discharge:  No. Pt plans to discharge to [facility] in attempt to enter shelter.  At discharge, do you have transportation home?: Yes,  bus pass and PART bus tickets provided Do you have the ability to pay for your medications: Yes,  mental health  Release of information consent forms completed and submitted to medical records by CSW.  Patient to Follow up at: Follow-up Information    [facility] Follow up.   Why:  Screening for possible admission on Tuesday, 04/20/18 at 8:00AM. Please bring: photo ID/Proof of [employer] residency, 30 day supply of all prescribed medication, and clothing. Thank you.  Contact information: [address] [place] [address] [phone]        [facility] Follow up on 04/15/2018.   Specialty:  Behavioral Health Why:  Hospital follow-up on Friday, 6/21 at 8:00AM. Please bring: photo ID, social security card, and any proof of income if you have it. Thank you. Contact information: [place] [address] [phone]        [facility] Follow up.   Why:  Please walk in within 3 days of hospital discharge to be assessed for outpatient mental health services including medication management and counseling. Walk in at 8:30AM Monday-Friday. Thank you.  Contact information: [address] [place], [place] [address] Phone: [phone] Fax: [phone]          Next level of care provider has access to [place] and Suicide Prevention discussed: Yes,  SPE completed with pt; pt declined to consent to collateral contact. SPI pamphlet and Mobile Crisis information provided.   Have you used any form of tobacco in the last 30 days? (Cigarettes, Smokeless Tobacco, Cigars, and/or Pipes): Yes  Has patient been referred to the Quitline?: Patient refused referral  Patient has been referred for  addiction treatment: Yes  Avelina Laine, LCSW 04/13/2018, 9:45 AM

## 2018-04-13 NOTE — [facility]HH Suicide Risk Assessment (Signed)
[name] Discharge Suicide Risk Assessment   Principal Problem: Substance induced mood disorder [facility]) Discharge Diagnoses:  Patient Active Problem List   Diagnosis Date Noted  . Substance induced mood disorder ([place]) [F19.94] 04/08/2018  . MDD (major depressive disorder), recurrent, severe, with psychosis ([name]) [F33.3] 04/07/2018  . Cocaine abuse with cocaine-induced mood disorder [name]) [F14.14] 04/06/2018    Total Time spent with patient: 30 minutes  Musculoskeletal: Strength & Muscle Tone: within normal limits Gait & Station: normal Patient leans: N/A  Psychiatric Specialty Exam: Review of Systems  Constitutional: Negative for chills and fever.  Respiratory: Negative for cough and shortness of breath.   Cardiovascular: Negative for chest pain.  Gastrointestinal: Negative for abdominal pain, heartburn, nausea and vomiting.  Psychiatric/Behavioral: Negative for depression, hallucinations and suicidal ideas. The patient is not nervous/anxious and does not have insomnia.     Blood pressure 110/84, pulse (!) 53, temperature 98 F (36.7 C), temperature source Oral, resp. rate 20, height '5\' 6"'$  (1.676 m), weight 59.6 kg (131 lb 6.3 oz), SpO2 99 %.Body mass index is 21.21 kg/m.  General Appearance: Casual and Fairly Groomed  Engineer, water::  Good  Speech:  Clear and Coherent and Normal Rate  Volume:  Normal  Mood:  Anxious and Depressed  Affect:  Appropriate, Congruent and Constricted  Thought Process:  Coherent and Goal Directed  Orientation:  Full (Time, Place, and Person)  Thought Content:  Logical  Suicidal Thoughts:  No  Homicidal Thoughts:  No  Memory:  Immediate;   Fair Recent;   Fair Remote;   Fair  Judgement:  Fair  Insight:  Fair  Psychomotor Activity:  Normal  Concentration:  Fair  Recall:  [employer] of Knowledge:Fair  Language: Fair  Akathisia:  No  Handed:    AIMS (if indicated):     Assets:  Desire for Improvement Resilience  Sleep:  Number of Hours: 6.75   Cognition: WNL  ADL's:  Intact   Mental Status Per Nursing Assessment::   On Admission:  Self-harm behaviors, Belief that plan would result in death, Self-harm thoughts  Demographic Factors:  Male, Caucasian, Low socioeconomic status, Living alone and Unemployed  Loss Factors: Financial problems/change in socioeconomic status  Historical Factors: Family history of mental illness or substance abuse and Impulsivity  Risk Reduction Factors:   Positive coping skills or problem solving skills  Continued Clinical Symptoms:  Depression:   Comorbid alcohol abuse/dependence Alcohol/Substance Abuse/Dependencies More than one psychiatric diagnosis Unstable or Poor Therapeutic Relationship Previous Psychiatric Diagnoses and Treatments  Cognitive Features That Contribute To Risk:  None    Suicide Risk:  Minimal: No identifiable suicidal ideation.  Patients presenting with no risk factors but with morbid ruminations; may be classified as minimal risk based on the severity of the depressive symptoms  Follow-up Information    [facility] Follow up.   Why:  Screening for possible admission on Tuesday, 04/20/18 at 8:00AM. Please bring: photo ID/Proof of [employer] residency, 30 day supply of all prescribed medication, and clothing. Thank you.  Contact information: [address] [place] [address] [phone]        [facility] Follow up on 04/15/2018.   Specialty:  Behavioral Health Why:  Hospital follow-up on Friday, 6/21 at 8:00AM. Please bring: photo ID, social security card, and any proof of income if you have it. Thank you. Contact information: [place] [address] [phone]        [facility] Follow up.  Why:  Please walk in within 3 days of hospital discharge to be assessed for outpatient mental health services including medication management and counseling. Walk in at 8:30AM Monday-Friday. Thank you.  Contact information: [address] [place], [place] [address] Phone: [phone] Fax: [phone]        Subjective Data:  Rick Bray is a 32 y/o M with psychiatric history of MDD and cocaine use disorder who was admitted voluntarily from [place] where he presented with worsening symptoms of depression, SI and suicide attempt via overdose of trazodone, and relapse of use of cocaine. Pt has recent history of discharge from [facility] on 03/31/18 directly to [facility] for substance use treatment where he only stayed for 2 days. Pt was medically cleared and then transferred to [facility] for additional treatment and stabilization. He was restarted on previous discharge medications of celexa and abilify. He had improvement of his presenting symptoms.  Today upon evaluation, pt shares, "I'm doing okay." He denies any specific concerns today aside from anxiety regarding uncertainty of where he will complete his substance use treatment. He is sleeping well. His appetite is good. He denies other physical complaints. He denies SI/HI/AH/VH. He is tolerating his medications well, and he is in agreement to continue his current regimen without changes. SW team also met with patient to discuss referrals to substance use treatment programs, and pt plans to stay in the [place] area. He has a [facility] screening for next week for substance use treatment that he plans to attend. He plans to have outpatient mental health follow up at the [place]. He was able to engage in safety planning including plan to return to [facility] or contact emergency services if he feels unable to maintain his own safety or the safety of others. Pt had no further questions, comments, or concerns.   Plan Of Care/Follow-up recommendations:   -Discharge to outpatient level of care  -MDD, recurrent, severe, with psychosis -Continue celexa 60m po qDay -Continue abilify 129mpo qDay  -Insomnia -Continue Remeron 1555mo qhs    -Anxiety -Continue vistaril 7m22m q6h prn anxiety/sleep  Activity:  as tolerated Diet:  normal Tests:  NA Other:  see above for [place] 04/13/2018, 9:58 AM

## 2018-04-13 NOTE — Progress Notes (Signed)
Discharge note:  Patient discharged home per MD order.  Patient received bus passes, plus a transfer ticket for transportation to [facility].  Reviewed AVS/transition record with patient and he indicated understanding.  Patient denies any thoughts of self harm.  Patient received all personal belongings from locker.  He left ambulatory with prescriptions and samples of his medications.

## 2018-04-13 NOTE — Progress Notes (Signed)
Recreation Therapy Notes  Date: 6.19.19 Time: 1000 Location: 300 Hall Dayroom  Group Topic: Stress Management  Goal Area(s) Addresses:  Patient will verbalize importance of using healthy stress management.  Patient will identify positive emotions associated with healthy stress management.   Intervention: Stress Management  Activity : Guided Imagery.  LRT introduced the stress management technique of guided imagery.  LRT read a script that lead patients through the process of taking a mental vacation.  Patients were to follow along as LRT read script to engage in activity.  Education:  Stress Management, Discharge Planning.   Education Outcome: Acknowledges edcuation/In group clarification offered/Needs additional education  Clinical Observations/Feedback: Pt did not attend group.    Victorino Sparrow, LRT/CTRS         Victorino Sparrow A 04/13/2018 11:47 AM

## 2018-04-13 NOTE — Discharge Summary (Addendum)
Physician Discharge Summary Note  Patient:  Rick Bray is an 32 y.o., male  MRN:  [id number]  DOB:  1986-06-04  Patient phone:  [phone] (home)   Patient address:   [place] [address],   Total Time spent with patient: Greater than 30 minutes  Date of Admission:  04/07/2018 Date of Discharge: 04/13/18  Reason for Admission: Worsening symptoms of depression, SI and suicide attempt via overdose of trazodone, and relapse of use of cocaine.  Principal Problem: Substance induced mood disorder [facility])  Discharge Diagnoses: Patient Active Problem List   Diagnosis Date Noted  . Substance induced mood disorder ([place]) [F19.94] 04/08/2018    Priority: High  . MDD (major depressive disorder), recurrent, severe, with psychosis ([place]) [F33.3] 04/07/2018    Priority: High  . Cocaine abuse with cocaine-induced mood disorder [facility]) [F14.14] 04/06/2018    Priority: Medium   Past Psychiatric History: Patient reported Hx of depression, 2 prior psychiatric admissions approximately 3 years ago & of recent here at [facility] for "the same reasons".  Reports prior suicide attempts by overdose in 2014 and by cutting himself in 2016 old healed scars on forearms.  He endorsed excessive worrying & high level of anxiety sometimes leading to panic symptoms.  Also acknowledged agoraphobia. States that he has been told he has "schizoaffective disorder".  Past Medical History:  Past Medical History:  Diagnosis Date  . Leukemia in remission ([place])   . MDD (major depressive disorder)   . Schizoaffective disorder [facility])     Past Surgical History:  Procedure Laterality Date  . Port a cath removed      Family History: History reviewed. No pertinent family history. Family Psychiatric  History: Reports father was diagnosed with schizophrenia  Social History:  Social History   Substance and Sexual Activity  Alcohol Use Not Currently     Social History   Substance and Sexual Activity  Drug Use Yes  .  Types: Marijuana, Methamphetamines   Comment: Last used Meth: 2 weeks ago and Marjuana was yesterday     Social History   Socioeconomic History  . Marital status: Single    Spouse name: Not on file  . Number of children: Not on file  . Years of education: Not on file  . Highest education level: Not on file  Occupational History  . Not on file  Social Needs  . Financial resource strain: Not on file  . Food insecurity:    Worry: Not on file    Inability: Not on file  . Transportation needs:    Medical: Not on file    Non-medical: Not on file  Tobacco Use  . Smoking status: Current Every Day Smoker    Packs/day: 1.00    Years: 15.00    Pack years: 15.00    Types: Cigarettes  . Smokeless tobacco: Never Used  Substance and Sexual Activity  . Alcohol use: Not Currently  . Drug use: Yes    Types: Marijuana, Methamphetamines    Comment: Last used Meth: 2 weeks ago and Marjuana was yesterday   . Sexual activity: Not on file  Lifestyle  . Physical activity:    Days per week: Not on file    Minutes per session: Not on file  . Stress: Not on file  Relationships  . Social connections:    Talks on phone: Not on file    Gets together: Not on file    Attends religious service: Not on file    Active member of  club or organization: Not on file    Attends meetings of clubs or organizations: Not on file    Relationship status: Not on file  Other Topics Concern  . Not on file  Social History Narrative  . Not on file   Hospital Course: (Per Md's discharge SRA): Rick Bray is a 32 y/o M with psychiatric history of MDD and cocaine use disorder who was admitted voluntarily from [facility] where he presented with worsening symptoms of depression, SI and suicide attempt via overdose of trazodone, and relapse of use of cocaine. Pt has recent history of discharge from [facility] on 03/31/18 directly to [facility] for substance use treatment where he only stayed for 2 days. Pt was medically cleared and then  transferred to [facility] for additional treatment and stabilization.He was restarted on previous discharge medications of celexa and abilify. He had improvement of his presenting symptoms.  Today upon evaluation, pt shares, "I'm doing okay." He denies any specific concerns today aside from anxiety regarding uncertainty of where he will complete his substance use treatment. He is sleeping well. His appetite is good. He denies other physical complaints. He denies SI/HI/AH/VH. He is tolerating his medications well, and he is in agreement to continue his current regimen without changes. SW team also met with patient to discuss referrals to substance use treatment programs, and pt plans to stay in the [place] area. He has a [facility] screening for next week for substance use treatment that he plans to attend. He plans to have outpatient mental health follow up at the [place]. He was able to engage in safety planning including plan to return to [facility] or contact emergency services if he feels unable to maintain his own safety or the safety of others. Pt had no further questions, comments, or concerns.  Plan Of Care/Follow-up recommendations:   -Discharge to outpatient level of care  -MDD, recurrent, severe, with psychosis -Continue celexa 42m po qDay -Continue abilify 166mpo qDay  -Insomnia -Continue Remeron 15 mg po qhs   -Anxiety -Continue vistaril 50 mg po q6h prn anxiety/sleep  Activity:  as tolerated Diet:  normal Tests:  NA  Physical Findings: AIMS: Facial and Oral Movements Muscles of Facial Expression: None, normal Lips and Perioral Area: None, normal Jaw: None, normal Tongue: None, normal,Extremity Movements Upper (arms, wrists, hands, fingers): None, normal Lower (legs, knees, ankles, toes): None, normal, Trunk Movements Neck, shoulders, hips: None, normal, Overall Severity Severity of abnormal movements (highest  score from questions above): None, normal Incapacitation due to abnormal movements: None, normal Patient's awareness of abnormal movements (rate only patient's report): No Awareness, Dental Status Current problems with teeth and/or dentures?: No Does patient usually wear dentures?: No  CIWA:    COWS:     Musculoskeletal: Strength & Muscle Tone: within normal limits Gait & Station: normal Patient leans: N/A  Psychiatric Specialty Exam: Physical Exam  Nursing note and vitals reviewed. Constitutional: He is oriented to person, place, and time. He appears well-developed and well-nourished.  HENT:  Head: Normocephalic.  Eyes: Pupils are equal, round, and reactive to light.  Neck: Normal range of motion.  Cardiovascular: Normal rate.  Respiratory: Effort normal.  GI: Soft.  Genitourinary:  Genitourinary Comments: Deferred  Musculoskeletal: Normal range of motion.  Neurological: He is alert and oriented to person, place, and time.  Skin: Skin is warm.    Review of Systems  Constitutional: Negative.   HENT: Negative.   Eyes: Negative.   Respiratory: Negative.   Cardiovascular: Negative.  Gastrointestinal: Negative.   Genitourinary: Negative.   Musculoskeletal: Negative.   Skin: Negative.   Neurological: Negative.   Endo/Heme/Allergies: Negative.   Psychiatric/Behavioral: Positive for depression (Stabilized with medication prior to discharge) and substance abuse (Hx. Polysubstance use disorder (Stable)). Negative for hallucinations, memory loss and suicidal ideas. The patient has insomnia (Stabilized with medication prior to discharge). The patient is not nervous/anxious.     Blood pressure 110/84, pulse (!) 53, temperature 98 F (36.7 C), temperature source Oral, resp. rate 20, height 5' 6"  (1.676 m), weight 59.6 kg (131 lb 6.3 oz), SpO2 99 %.Body mass index is 21.21 kg/m.  See Md's SRA.   Have you used any form of tobacco in the last 30 days? (Cigarettes, Smokeless Tobacco,  Cigars, and/or Pipes): Yes  Has this patient used any form of tobacco in the last 30 days? (Cigarettes, Smokeless Tobacco, Cigars, and/or Pipes):Yes, an FDA-approved tobacco cessation medication was offered at discharge.  Blood Alcohol level:  Lab Results  Component Value Date   ETH <10 04/06/2018   ETH <10 82/80/0349   Metabolic Disorder Labs:  Lab Results  Component Value Date   HGBA1C 5.2 03/27/2018   MPG 102.54 03/27/2018   No results found for: PROLACTIN Lab Results  Component Value Date   CHOL 128 03/27/2018   TRIG 52 03/27/2018   HDL 49 03/27/2018   CHOLHDL 2.6 03/27/2018   VLDL 10 03/27/2018   LDLCALC 69 03/27/2018    See Psychiatric Specialty Exam and Suicide Risk Assessment completed by Attending Physician prior to discharge.  Discharge destination:  Other:  [facility]  Is patient on multiple antipsychotic therapies at discharge:  No   Has Patient had three or more failed trials of antipsychotic monotherapy by history:  No  Recommended Plan for Multiple Antipsychotic Therapies: NA  Allergies as of 04/13/2018      Reactions   Risperidone And Related    Faint      Medication List    STOP taking these medications   doxycycline 100 MG tablet Commonly known as:  VIBRA-TABS   traZODone 50 MG tablet Commonly known as:  DESYREL     TAKE these medications     Indication  ARIPiprazole 10 MG tablet Commonly known as:  ABILIFY Take 1 tablet (10 mg total) by mouth at bedtime. For mood control What changed:    medication strength  how much to take  Indication:  Mood control   citalopram 20 MG tablet Commonly known as:  CELEXA Take 1 tablet (20 mg total) by mouth daily. For depression Start taking on:  04/14/2018 What changed:    medication strength  how much to take  additional instructions  Indication:  Depression   clindamycin 300 MG capsule Commonly known as:  CLEOCIN Take 1 capsule (300 mg total) by mouth every 8 (eight) hours. For infection   Indication:  Infection   hydrOXYzine 50 MG tablet Commonly known as:  ATARAX/VISTARIL Take 1 tablet (50 mg total) by mouth every 6 (six) hours as needed for anxiety (sleep). What changed:    medication strength  how much to take  reasons to take this  Indication:  Feeling Anxious, Insomnia   mirtazapine 15 MG tablet Commonly known as:  REMERON Take 1 tablet (15 mg total) by mouth at bedtime. For depression/sleep  Indication:  Major Depressive Disorder, Sleep   nicotine polacrilex 2 MG gum Commonly known as:  NICORETTE Take 1 each (2 mg total) by mouth as needed for smoking cessation. (May purchase  from over the counter): For smoking cessation  Indication:  Nicotine Addiction      Follow-up Information    Services, [facility] Follow up.   Why:  Screening for possible admission on Tuesday, 04/20/18 at 8:00AM. Please bring: photo ID/Proof of [employer] residency, 30 day supply of all prescribed medication, and clothing. Thank you.  Contact information: [address] [place] [address] [phone]        [facility] Follow up on 04/15/2018.   Specialty:  Behavioral Health Why:  Hospital follow-up on Friday, 6/21 at 8:00AM. Please bring: photo ID, social security card, and any proof of income if you have it. Thank you. Contact information: [place] [address] [phone]          Follow-up recommendations:  Activity:  As tolerated Diet: As recommended by your primary care doctor. Keep all scheduled follow-up appointments as recommended.  Comments:  Patient is instructed prior to discharge to: Take all medications as prescribed by his/her mental healthcare provider. Report any adverse effects and or reactions from the medicines to his/her outpatient provider promptly. Patient has been instructed & cautioned: To not engage in alcohol and or illegal drug use while on prescription medicines. In the event of worsening symptoms, patient is  instructed to call the crisis hotline, 911 and or go to the nearest ED for appropriate evaluation and treatment of symptoms. To follow-up with his/her primary care provider for your other medical issues, concerns and or health care needs.   Signed: Lindell Spar, NP, PMHNP, FNP-BC 04/13/2018, 9:24 AM  Patient seen, Suicide Assessment Completed.  Disposition Plan Reviewed

## 2018-04-13 NOTE — Tx Team (Signed)
Interdisciplinary Treatment and Diagnostic Plan Update  04/13/2018 Time of Session: [id number] Rick Bray MRN: [id number]  Principal Diagnosis: MDD, recurrent, severe, with psychosis  Secondary Diagnoses: Principal Problem:   Substance induced mood disorder ([place]) Active Problems:   Cocaine abuse with cocaine-induced mood disorder ([place])   MDD (major depressive disorder), recurrent, severe, with psychosis ([place])   Current Medications:  Current Facility-Administered Medications  Medication Dose Route Frequency Provider Last Rate Last Dose  . acetaminophen (TYLENOL) tablet 650 mg  650 mg Oral Q6H PRN Ethelene Hal, NP   650 mg at 04/09/18 0429  . alum & mag hydroxide-simeth (MAALOX/MYLANTA) 200-200-20 MG/5ML suspension 30 mL  30 mL Oral Q4H PRN Ethelene Hal, NP   30 mL at 04/07/18 2226  . ARIPiprazole (ABILIFY) tablet 10 mg  10 mg Oral QHS Nanci Pina, FNP   10 mg at 04/12/18 2005  . citalopram (CELEXA) tablet 20 mg  20 mg Oral Daily Pennelope Bracken, MD   20 mg at 04/13/18 0815  . clindamycin (CLEOCIN) capsule 300 mg  300 mg Oral Q8H Simon, Spencer E, PA-C   300 mg at 04/13/18 4696  . feeding supplement (ENSURE ENLIVE) (ENSURE ENLIVE) liquid 237 mL  237 mL Oral BID BM Cobos, Fernando A, MD   237 mL at 04/12/18 1438  . hydrOXYzine (ATARAX/VISTARIL) tablet 50 mg  50 mg Oral Q6H PRN Pennelope Bracken, MD   50 mg at 04/11/18 0248  . magnesium hydroxide (MILK OF MAGNESIA) suspension 30 mL  30 mL Oral Daily PRN Ethelene Hal, NP      . mirtazapine (REMERON) tablet 15 mg  15 mg Oral QHS Pennelope Bracken, MD   15 mg at 04/12/18 2121  . nicotine polacrilex (NICORETTE) gum 2 mg  2 mg Oral PRN Ethelene Hal, NP   2 mg at 04/12/18 1613  . ondansetron (ZOFRAN) tablet 4 mg  4 mg Oral Q8H PRN Ethelene Hal, NP       PTA Medications: Medications Prior to Admission  Medication Sig Dispense Refill Last Dose  . ARIPiprazole (ABILIFY) 5 MG  tablet Take 1 tablet (5 mg total) by mouth at bedtime. For mood control 30 tablet 0 04/05/2018 at Unknown time  . citalopram (CELEXA) 10 MG tablet Take 1 tablet (10 mg total) by mouth daily. For mood control 30 tablet 0 04/05/2018 at Unknown time  . doxycycline (VIBRA-TABS) 100 MG tablet Take 1 tablet (100 mg total) by mouth every 12 (twelve) hours. (Patient not taking: Reported on 04/05/2018) 10 tablet 0 Not Taking at Unknown time  . hydrOXYzine (ATARAX/VISTARIL) 25 MG tablet Take 1 tablet (25 mg total) by mouth every 6 (six) hours as needed for anxiety. 30 tablet 0 04/05/2018 at Unknown time  . [DISCONTINUED] traZODone (DESYREL) 50 MG tablet Take 1 tablet (50 mg total) by mouth at bedtime as needed for sleep. 30 tablet 0 04/06/2018 at Unknown time    Patient Stressors: Financial difficulties Medication change or noncompliance Occupational concerns Substance abuse  Patient Strengths: Average or above average intelligence Communication skills Physical Health  Treatment Modalities: Medication Management, Group therapy, Case management,  1 to 1 session with clinician, Psychoeducation, Recreational therapy.   Physician Treatment Plan for Primary Diagnosis: MDD, recurrent, severe, with psychosis  Medication Management: Evaluate patient's response, side effects, and tolerance of medication regimen.  Therapeutic Interventions: 1 to 1 sessions, Unit Group sessions and Medication administration.  Evaluation of Outcomes: Adequate for discharge   Physician Treatment Plan  for Secondary Diagnosis: Principal Problem:   Substance induced mood disorder (HCC) Active Problems:   Cocaine abuse with cocaine-induced mood disorder (H[facility])   MDD (major depressive disorder), recurrent, severe, with psychosis ([place])   Medication Management: Evaluate patient's response, side effects, and tolerance of medication regimen.  Therapeutic Interventions: 1 to 1 sessions, Unit Group sessions and Medication  administration.  Evaluation of Outcomes: Adequate for discharge   RN Treatment Plan for Primary Diagnosis: MDD, recurrent, severe, with psychosis Long Term Goal(s): Knowledge of disease and therapeutic regimen to maintain health will improve  Short Term Goals: Ability to remain free from injury will improve, Ability to verbalize frustration and anger appropriately will improve and Ability to demonstrate self-control  Medication Management: RN will administer medications as ordered by provider, will assess and evaluate patient's response and provide education to patient for prescribed medication. RN will report any adverse and/or side effects to prescribing provider.  Therapeutic Interventions: 1 on 1 counseling sessions, Psychoeducation, Medication administration, Evaluate responses to treatment, Monitor vital signs and CBGs as ordered, Perform/monitor CIWA, COWS, AIMS and Fall Risk screenings as ordered, Perform wound care treatments as ordered.  Evaluation of Outcomes: Adequate for discharge   LCSW Treatment Plan for Primary Diagnosis: MDD, recurrent, severe, with psychosis Long Term Goal(s): Safe transition to appropriate next level of care at discharge, Engage patient in therapeutic group addressing interpersonal concerns.  Short Term Goals: Engage patient in aftercare planning with referrals and resources, Facilitate patient progression through stages of change regarding substance use diagnoses and concerns and Identify triggers associated with mental health/substance abuse issues  Therapeutic Interventions: Assess for all discharge needs, 1 to 1 time with Social worker, Explore available resources and support systems, Assess for adequacy in community support network, Educate family and significant other(s) on suicide prevention, Complete Psychosocial Assessment, Interpersonal group therapy.  Evaluation of Outcomes: Adequate for discharge   Progress in Treatment: Attending groups:  No. Participating in groups: No. Pt remained isolative in room during admission.  Taking medication as prescribed: Yes. Toleration medication: Yes. Family/Significant other contact made: SPE completed with pt; pt declined to consent to collateral contact.  Patient understands diagnosis: Yes. Discussing patient identified problems/goals with staff: Yes. Medical problems stabilized or resolved: Yes. Denies suicidal/homicidal ideation: Yes. Issues/concerns per patient self-inventory: No. Other: n/a   New problem(s) identified: No, Describe:  n/a  New Short Term/Long Term Goal(s): detox, medication management for mood stabilization; elimination of SI thoughts; development of comprehensive mental wellness/sobriety plan.   Patient Goals:  "I am homeless. I need help with support and housing."   Discharge Plan or Barriers: Pt has chosen to discharge and go to [facility] in hopes of entering shelter. Referral made to emergency shelter at [facility] [place]. Pt also provided with [employer], [employer], [facility] outpatient clinic information, and AA/NA lists for [employer]. Pt has [facility] on Tuesday 6/25 if he chooses to return to [facility] for outpatient. [facility]-no beds available to day. Pt is aware that he may not get accepted into [facility] or any other shelter in [place] and is choosing to go there anyway. Pt was last admitted to [facility] 2 weeks ago and was referred to [facility]. Pt left [facility] because "I didn't like the groups." He is homeless and has no current providers. [place] pamphlet, [facility] information, and AA/NA information provided to patient for additional community support and resources.   Reason for Continuation of Hospitalization: none  Estimated Length of  Stay: Wednesday, 04/13/18  Attendees: Patient:  04/13/2018 9:46 AM  Physician: Dr. Nancy Fetter MD; Dr. Mallie Darting MD 04/13/2018 9:46 AM  Nursing: Mila Palmer RN:  04/13/2018 9:46 AM  RN Care Manager:x 04/13/2018 9:46 AM  Social Worker: Janice Norrie LCSW 04/13/2018 9:46 AM  Recreational Therapist: x 04/13/2018 9:46 AM  Other: Lindell Spar NP 04/13/2018 9:46 AM  Other:  04/13/2018 9:46 AM  Other: 04/13/2018 9:46 AM    Scribe for Treatment Team: Avelina Laine, LCSW 04/13/2018 9:46 AM

## 2018-04-13 NOTE — Progress Notes (Addendum)
Pt had phone interview with Clearnce Sorrel from [place]. Darden Dates will stop by to meet with pt prior to discharge.   Haidyn Kilburg S. Ouida Sills, MSW, LCSW Clinical Social Worker 04/13/2018 10:56 AM    Pt given [facility] [place] information and asked that he just be allowed to discharge before meeting with Tee. "I'd rather try my luck in [place] first." CSW contacted Tee to let him know not to visit patient. CSW and pt contacted [facility] of [facility] had not been received. CSW confirmed fax number and re-faxed. Pt plans to follow-up there first after discharge. CSW went over travel instructions ([place] bus to get to [facility]).  Tiana Sivertson S. Ouida Sills, MSW, LCSW Clinical Social Worker 04/13/2018 2:05 PM

## 2018-04-13 NOTE — Progress Notes (Signed)
CSW, Dr. Nancy Fetter and pt met this morning to discuss discharge. Pt is aware that he has [facility] screening on Tuesday, 6/25 at 8am. He is choosing to go to [facility] in attempt to enter a shelter. [facility] [place] [facility] and to enter the [facility]. He is currently in review. He may also arrive at 10am on Monday in person for the 2 week shelter program. Pt has been given mutliple resources for both [place] ([facility], [place], [place], [employer], AA/NA [employer]) and [employer] ([employer] fast streamer, [employer] of [place], Research scientist (physical sciences), [facility], [place]). He has been provided with PART bus and [place] bus pass with schedule in order to get transportation to [place], [place]. Pt needs ID to enter many of these programs and is aware that the [facility] may be able to help him with this. Pt denies SI/HI/AVH and states that he is prepared for discharge today. He is aware that it is not a guarantee that he will be accepted into [place] or any other shelter, but is wanting to discharge to [place]. Ouida Sills, MSW, LCSW Clinical Social Worker 04/13/2018 9:44 AM

## 2018-04-13 NOTE — Progress Notes (Signed)
Patient ID: Rick Bray, male   DOB: 04-07-1986, 32 y.o.   MRN: [id number]   Pt currently presents with a flat affect and guarded behavior. Reports ongoing anxiety. Pt states goal is to "figure out a discharge plan." Pt forwards little to Probation officer.   Pt provided with medications per providers orders. Pt's labs and vitals were monitored throughout the night. Pt given a 1:1 about emotional and mental status. Pt supported and encouraged to express concerns and questions. Pt educated on medications.  Pt's safety ensured with 15 minute and environmental checks. Pt currently denies SI/HI and A/V hallucinations. Pt verbally agrees to seek staff if SI/HI or A/VH occurs and to consult with staff before acting on any harmful thoughts. Will continue POC.

## 2019-05-01 IMAGING — CR DG CHEST 2V
2 series · 2 of 2 positions shown · non-contrast
Comparison: None in PACs

CLINICAL DATA: Chest pain with no other symptoms. History of
leukemia in remission. Current smoker.

EXAM:
CHEST - 2 VIEW

[chest pa]
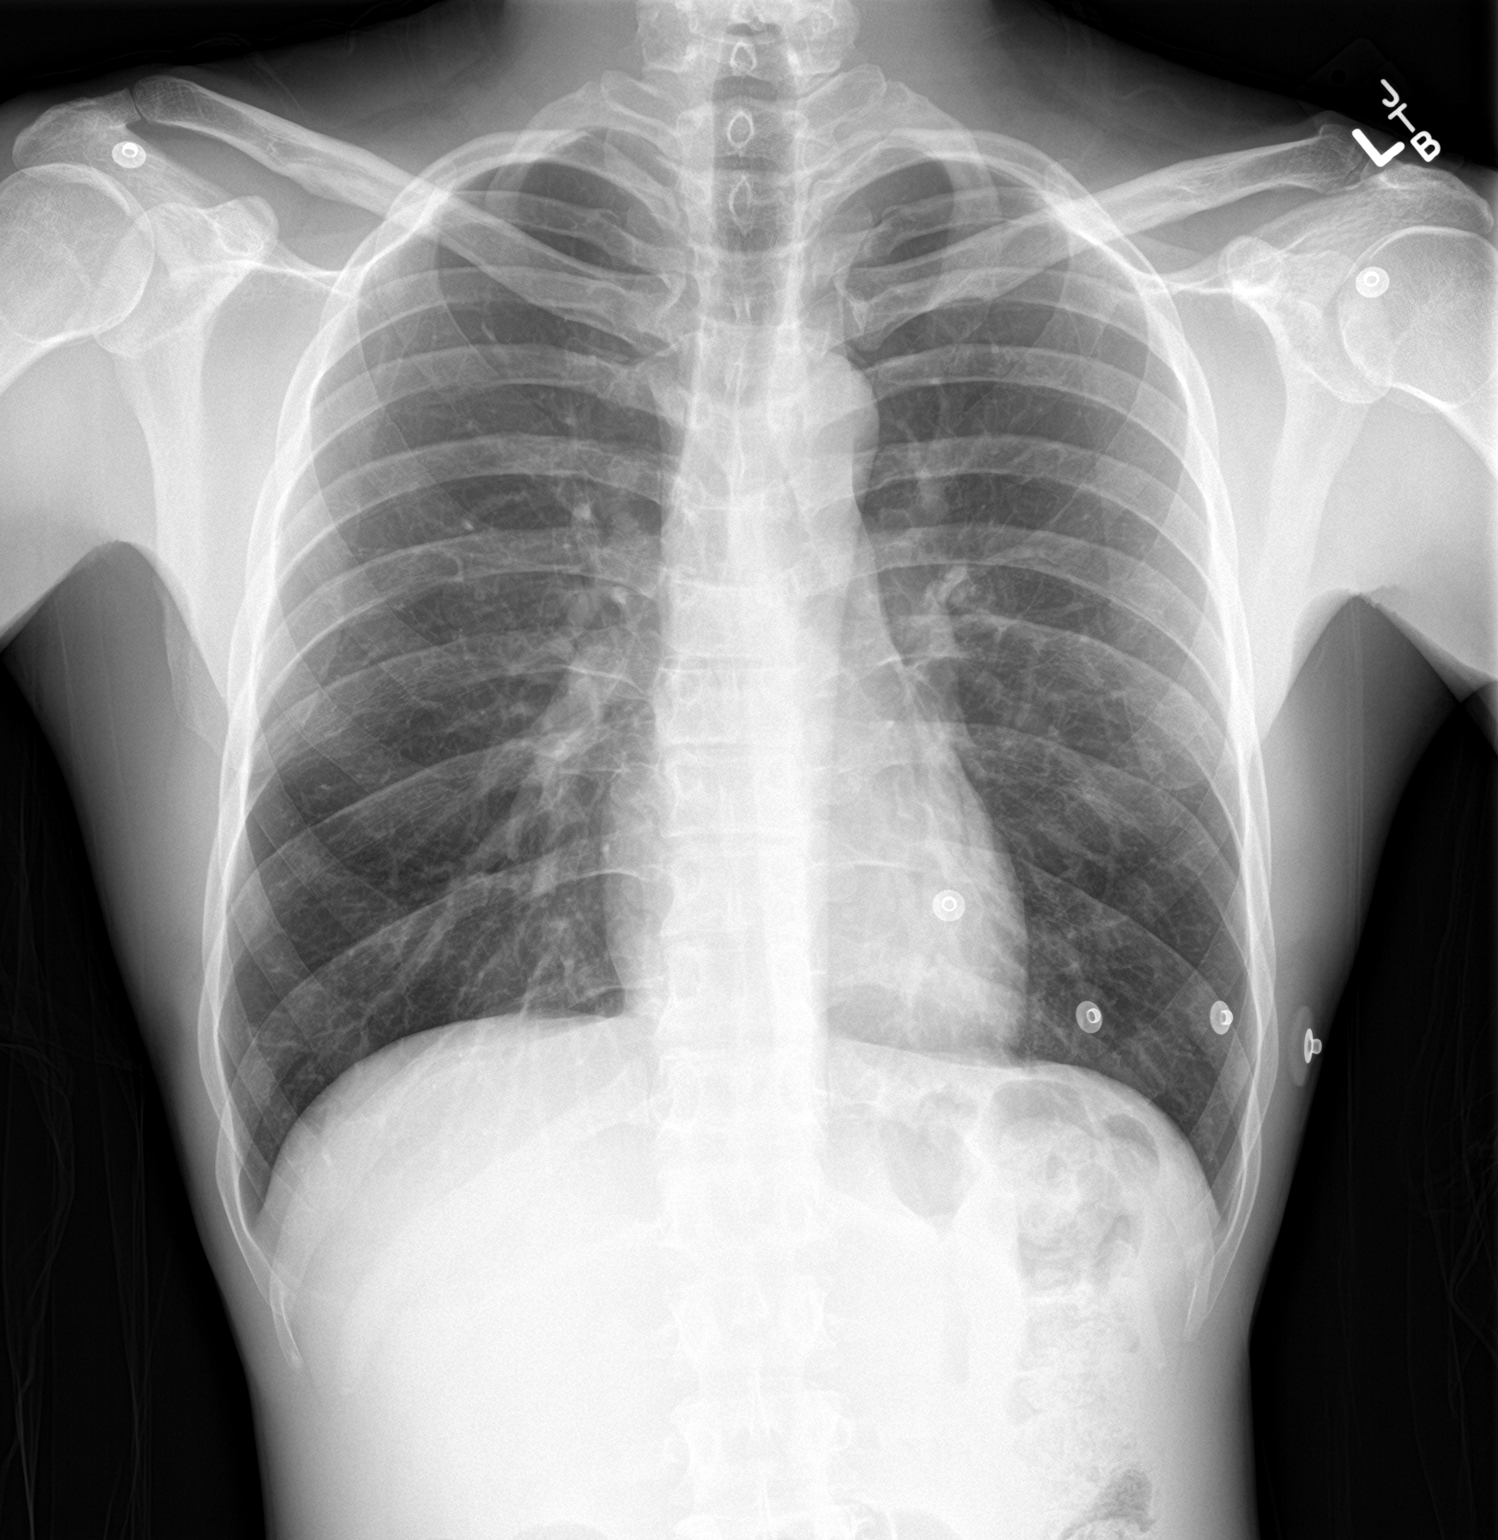

[chest lat]
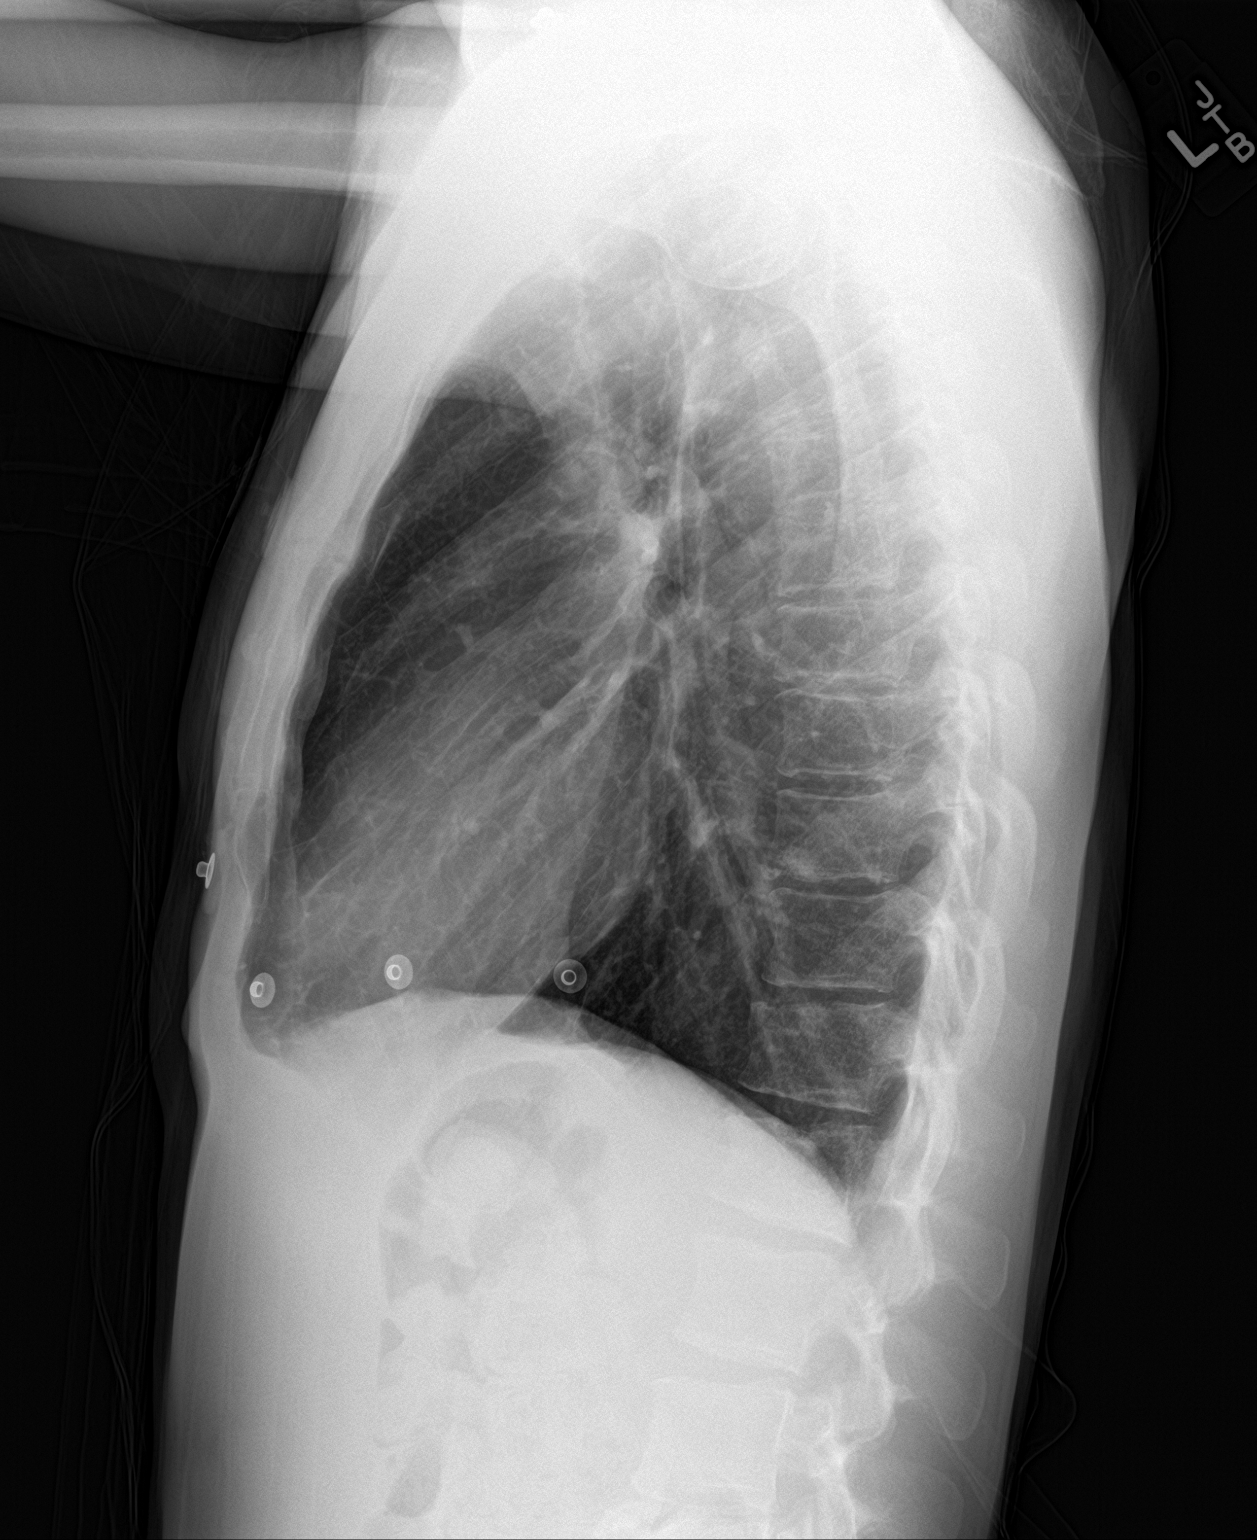

[2 of 2 positions shown; findings below may reference images not displayed]

FINDINGS: The lungs are adequately inflated. There is no focal infiltrate.
There is no pleural effusion. The heart and pulmonary vascularity
are normal. There is no mediastinal or hilar lymphadenopathy. The
bony thorax is unremarkable.
IMPRESSION: There is no active cardiopulmonary disease.
